# Patient Record
Sex: Female | Born: 1940
Health system: Southern US, Community
[De-identification: ages and names within clinical notes are randomized; demographics above are authoritative.]

## PROBLEM LIST (undated history)

## (undated) DIAGNOSIS — K635 Polyp of colon: Secondary | ICD-10-CM

## (undated) DIAGNOSIS — E785 Hyperlipidemia, unspecified: Secondary | ICD-10-CM

## (undated) DIAGNOSIS — I8393 Asymptomatic varicose veins of bilateral lower extremities: Secondary | ICD-10-CM

## (undated) DIAGNOSIS — M810 Age-related osteoporosis without current pathological fracture: Secondary | ICD-10-CM

## (undated) DIAGNOSIS — M1990 Unspecified osteoarthritis, unspecified site: Secondary | ICD-10-CM

## (undated) DIAGNOSIS — Z9989 Dependence on other enabling machines and devices: Secondary | ICD-10-CM

## (undated) DIAGNOSIS — K222 Esophageal obstruction: Secondary | ICD-10-CM

## (undated) DIAGNOSIS — K219 Gastro-esophageal reflux disease without esophagitis: Secondary | ICD-10-CM

## (undated) DIAGNOSIS — Z8619 Personal history of other infectious and parasitic diseases: Secondary | ICD-10-CM

## (undated) DIAGNOSIS — G4733 Obstructive sleep apnea (adult) (pediatric): Secondary | ICD-10-CM

## (undated) DIAGNOSIS — L920 Granuloma annulare: Secondary | ICD-10-CM

## (undated) DIAGNOSIS — E039 Hypothyroidism, unspecified: Secondary | ICD-10-CM

## (undated) HISTORY — PX: CHOLECYSTECTOMY: SHX55

## (undated) HISTORY — PX: ESOPHAGOGASTRODUODENOSCOPY: SHX1529

## (undated) HISTORY — PX: BREAST BIOPSY: SHX20

## (undated) HISTORY — PX: COLONOSCOPY: SHX174

## (undated) HISTORY — PX: OTHER SURGICAL HISTORY: SHX169

## (undated) HISTORY — PX: CARPAL TUNNEL RELEASE: SHX101

## (undated) HISTORY — PX: DILATION AND CURETTAGE OF UTERUS: SHX78

## (undated) HISTORY — PX: JOINT REPLACEMENT: SHX530

## (undated) HISTORY — PX: BACK SURGERY: SHX140

---

## 1971-09-19 HISTORY — PX: TUBAL LIGATION: SHX77

## 2006-02-13 ENCOUNTER — Ambulatory Visit: Payer: Self-pay | Admitting: Internal Medicine

## 2006-04-06 ENCOUNTER — Ambulatory Visit: Payer: Self-pay | Admitting: Rheumatology

## 2006-05-23 ENCOUNTER — Ambulatory Visit: Payer: Self-pay | Admitting: Unknown Physician Specialty

## 2006-10-05 ENCOUNTER — Ambulatory Visit: Payer: Self-pay | Admitting: Unknown Physician Specialty

## 2007-03-19 ENCOUNTER — Ambulatory Visit: Payer: Self-pay | Admitting: Internal Medicine

## 2007-11-05 ENCOUNTER — Ambulatory Visit: Payer: Self-pay | Admitting: Unknown Physician Specialty

## 2008-04-21 ENCOUNTER — Ambulatory Visit: Payer: Self-pay | Admitting: Internal Medicine

## 2009-04-27 ENCOUNTER — Ambulatory Visit: Payer: Self-pay | Admitting: Internal Medicine

## 2010-03-08 ENCOUNTER — Ambulatory Visit: Payer: Self-pay | Admitting: Internal Medicine

## 2010-05-10 ENCOUNTER — Ambulatory Visit: Payer: Self-pay | Admitting: Internal Medicine

## 2011-06-06 ENCOUNTER — Encounter: Payer: Self-pay | Admitting: Internal Medicine

## 2011-06-15 ENCOUNTER — Ambulatory Visit: Payer: Self-pay | Admitting: Internal Medicine

## 2011-06-19 ENCOUNTER — Encounter: Payer: Self-pay | Admitting: Internal Medicine

## 2011-07-20 ENCOUNTER — Encounter: Payer: Self-pay | Admitting: Internal Medicine

## 2011-08-19 ENCOUNTER — Encounter: Payer: Self-pay | Admitting: Internal Medicine

## 2011-09-19 ENCOUNTER — Encounter: Payer: Self-pay | Admitting: Internal Medicine

## 2011-09-19 HISTORY — PX: KNEE ARTHROPLASTY: SHX992

## 2012-01-17 ENCOUNTER — Ambulatory Visit: Payer: Self-pay

## 2012-02-17 HISTORY — PX: TOTAL KNEE ARTHROPLASTY: SHX125

## 2012-02-26 ENCOUNTER — Ambulatory Visit: Payer: Self-pay | Admitting: General Practice

## 2012-02-26 LAB — URINALYSIS, COMPLETE
Bacteria: NONE SEEN
Glucose,UR: NEGATIVE mg/dL (ref 0–75)
Ph: 6 (ref 4.5–8.0)
Specific Gravity: 1.016 (ref 1.003–1.030)
Squamous Epithelial: 4

## 2012-02-26 LAB — CBC
HGB: 13.9 g/dL (ref 12.0–16.0)
MCH: 32.2 pg (ref 26.0–34.0)
MCHC: 33.4 g/dL (ref 32.0–36.0)
MCV: 96 fL (ref 80–100)
RBC: 4.31 10*6/uL (ref 3.80–5.20)
RDW: 12.5 % (ref 11.5–14.5)

## 2012-02-26 LAB — MRSA PCR SCREENING

## 2012-02-26 LAB — APTT: Activated PTT: 27.5 secs (ref 23.6–35.9)

## 2012-02-26 LAB — PROTIME-INR: INR: 0.9

## 2012-02-26 LAB — BASIC METABOLIC PANEL
Anion Gap: 7 (ref 7–16)
BUN: 12 mg/dL (ref 7–18)
EGFR (Non-African Amer.): >60

## 2012-02-26 LAB — SEDIMENTATION RATE: Erythrocyte Sed Rate: 7 mm/hr (ref 0–30)

## 2012-03-11 ENCOUNTER — Inpatient Hospital Stay: Payer: Self-pay

## 2012-03-12 LAB — BASIC METABOLIC PANEL
Anion Gap: 5 — ABNORMAL LOW (ref 7–16)
Calcium, Total: 8.4 mg/dL — ABNORMAL LOW (ref 8.5–10.1)
Co2: 31 mmol/L (ref 21–32)
Creatinine: 0.8 mg/dL (ref 0.60–1.30)
EGFR (Non-African Amer.): >60
Glucose: 112 mg/dL — ABNORMAL HIGH (ref 65–99)
Potassium: 4.4 mmol/L (ref 3.5–5.1)

## 2012-03-12 LAB — PLATELET COUNT: Platelet: 119 10*3/uL — ABNORMAL LOW (ref 150–440)

## 2012-03-13 LAB — BASIC METABOLIC PANEL
BUN: 8 mg/dL (ref 7–18)
Calcium, Total: 8 mg/dL — ABNORMAL LOW (ref 8.5–10.1)
Chloride: 106 mmol/L (ref 98–107)
Co2: 30 mmol/L (ref 21–32)
EGFR (Non-African Amer.): >60
Potassium: 3.7 mmol/L (ref 3.5–5.1)
Sodium: 143 mmol/L (ref 136–145)

## 2012-04-29 ENCOUNTER — Ambulatory Visit: Payer: Self-pay | Admitting: General Practice

## 2012-04-29 DIAGNOSIS — I499 Cardiac arrhythmia, unspecified: Secondary | ICD-10-CM

## 2012-04-29 LAB — HEMOGLOBIN: HGB: 13.3 g/dL (ref 12.0–16.0)

## 2012-05-10 ENCOUNTER — Ambulatory Visit: Payer: Self-pay | Admitting: General Practice

## 2012-05-10 LAB — BASIC METABOLIC PANEL
Anion Gap: 4 — ABNORMAL LOW (ref 7–16)
BUN: 14 mg/dL (ref 7–18)
Calcium, Total: 9.1 mg/dL (ref 8.5–10.1)
EGFR (Non-African Amer.): >60
Glucose: 121 mg/dL — ABNORMAL HIGH (ref 65–99)
Osmolality: 281 (ref 275–301)
Potassium: 4.1 mmol/L (ref 3.5–5.1)

## 2012-05-10 LAB — URINALYSIS, COMPLETE
Bilirubin,UR: NEGATIVE
Ketone: NEGATIVE
Ph: 6 (ref 4.5–8.0)
RBC,UR: <1 /HPF (ref 0–5)
Specific Gravity: 1.004 (ref 1.003–1.030)
Squamous Epithelial: 1
WBC UR: 2 /HPF (ref 0–5)

## 2012-05-10 LAB — CBC
HGB: 12.4 g/dL (ref 12.0–16.0)
MCH: 30.8 pg (ref 26.0–34.0)
RBC: 4.02 10*6/uL (ref 3.80–5.20)
WBC: 6.8 10*3/uL (ref 3.6–11.0)

## 2012-05-10 LAB — PROTIME-INR: INR: 1

## 2012-05-27 ENCOUNTER — Other Ambulatory Visit: Payer: Self-pay | Admitting: Orthopedic Surgery

## 2012-05-29 ENCOUNTER — Other Ambulatory Visit (HOSPITAL_COMMUNITY): Payer: Medicare Other

## 2012-05-31 ENCOUNTER — Encounter (HOSPITAL_COMMUNITY): Payer: Self-pay | Admitting: Pharmacy Technician

## 2012-06-12 ENCOUNTER — Other Ambulatory Visit (HOSPITAL_COMMUNITY): Payer: Medicare Other

## 2012-06-13 NOTE — Pre-Procedure Instructions (Signed)
19 Joanne Nash  06/13/2012   Your procedure is scheduled on: Monday, September 30th.  Report to [facility] at 10:30 AM.  Call this number if you have problems the morning of surgery: [phone]   Remember:   Do not eat food or anything to drink:After Midnight.     Take these medicines the morning of surgery with A SIP OF WATER: Citalopram (Celexa), Levothyroxine(synthyroid), Omeprazole (Prilosec).   Do not wear jewelry, make-up or nail polish.  Do not wear lotions, powders, or perfumes. You may wear deodorant.  Do not shave 48 hours prior to surgery. Men may shave face and neck.  Do not bring valuables to the hospital.  Contacts, dentures or bridgework may not be worn into surgery.  Leave suitcase in the car. After surgery it may be brought to your room.  For patients admitted to the hospital, checkout time is 11:00 AM the day of discharge.   Patients discharged the day of surgery will not be allowed to drive home.  Name and phone number of your driver: NA  Special Instructions: Incentive Spirometry - Practice and bring it with you on the day of surgery. Shower using CHG 2 nights before surgery and the night before surgery.  If you shower the day of surgery use CHG.  Use special wash - you have one bottle of CHG for all showers.  You should use approximately 1/3 of the bottle for each shower.   Please read over the following fact sheets that you were given: Pain Booklet, Coughing and Deep Breathing, Blood Transfusion Information and Surgical Site Infection Prevention

## 2012-06-14 ENCOUNTER — Encounter (HOSPITAL_COMMUNITY)
Admission: RE | Admit: 2012-06-14 | Discharge: 2012-06-14 | Disposition: A | Discharge status: Home / Self Care | Payer: Medicare Other | Source: Ambulatory Visit | Attending: Orthopedic Surgery | Admitting: Orthopedic Surgery

## 2012-06-14 ENCOUNTER — Encounter (HOSPITAL_COMMUNITY): Payer: Self-pay

## 2012-06-14 HISTORY — DX: Hyperlipidemia, unspecified: E78.5

## 2012-06-14 HISTORY — DX: Hypothyroidism, unspecified: E03.9

## 2012-06-14 HISTORY — DX: Gastro-esophageal reflux disease without esophagitis: K21.9

## 2012-06-14 HISTORY — DX: Unspecified osteoarthritis, unspecified site: M19.90

## 2012-06-14 LAB — URINALYSIS, ROUTINE W REFLEX MICROSCOPIC
Bilirubin Urine: NEGATIVE
Glucose, UA: NEGATIVE mg/dL
Ketones, ur: NEGATIVE mg/dL
Specific Gravity, Urine: 1.02 (ref 1.005–1.030)
pH: 6.5 (ref 5.0–8.0)

## 2012-06-14 LAB — CBC WITH DIFFERENTIAL/PLATELET
HCT: 42.3 % (ref 36.0–46.0)
Hemoglobin: 13.9 g/dL (ref 12.0–15.0)
Lymphocytes Relative: 34 % (ref 12–46)
Lymphs Abs: 2.2 10*3/uL (ref 0.7–4.0)
Monocytes Absolute: 0.5 10*3/uL (ref 0.1–1.0)
Monocytes Relative: 7 % (ref 3–12)
Neutro Abs: 3.6 10*3/uL (ref 1.7–7.7)
WBC: 6.4 10*3/uL (ref 4.0–10.5)

## 2012-06-14 LAB — COMPREHENSIVE METABOLIC PANEL
BUN: 14 mg/dL (ref 6–23)
CO2: 30 mEq/L (ref 19–32)
Chloride: 102 mEq/L (ref 96–112)
Creatinine, Ser: 0.6 mg/dL (ref 0.50–1.10)
GFR calc non Af Amer: 89 mL/min — ABNORMAL LOW (ref >90)
Total Bilirubin: 0.7 mg/dL (ref 0.3–1.2)

## 2012-06-14 LAB — URINE MICROSCOPIC-ADD ON

## 2012-06-14 LAB — APTT: aPTT: 29 seconds (ref 24–37)

## 2012-06-14 LAB — PROTIME-INR
INR: 1 (ref 0.00–1.49)
Prothrombin Time: 13.1 seconds (ref 11.6–15.2)

## 2012-06-14 NOTE — Progress Notes (Signed)
I faxed a request to [facility] for EKG, D?c Summary and Anesthesia  Record for surgery done earlier this year.  I faxed a request to sleep med in [place] requesting sleep study results.

## 2012-06-16 MED ORDER — POVIDONE-IODINE 7.5 % EX SOLN
Freq: Once | CUTANEOUS | Status: DC
  Filled 2012-06-16: qty 118

## 2012-06-16 MED ORDER — CEFAZOLIN SODIUM-DEXTROSE 2-3 GM-% IV SOLR
2.0000 g | INTRAVENOUS | Status: AC
  Administered 2012-06-17: 2 g via INTRAVENOUS

## 2012-06-17 ENCOUNTER — Encounter (HOSPITAL_COMMUNITY): Payer: Self-pay | Admitting: *Deleted

## 2012-06-17 ENCOUNTER — Inpatient Hospital Stay (HOSPITAL_COMMUNITY): Payer: Medicare Other

## 2012-06-17 ENCOUNTER — Inpatient Hospital Stay (HOSPITAL_COMMUNITY): Payer: Medicare Other | Admitting: Certified Registered"

## 2012-06-17 ENCOUNTER — Inpatient Hospital Stay (HOSPITAL_COMMUNITY)
Admission: RE | Admit: 2012-06-17 | Discharge: 2012-06-18 | DRG: 484 | Disposition: A | Discharge status: Home / Self Care | Payer: Medicare Other | Source: Ambulatory Visit | Attending: Orthopedic Surgery | Admitting: Orthopedic Surgery

## 2012-06-17 ENCOUNTER — Encounter (HOSPITAL_COMMUNITY): Payer: Self-pay | Admitting: Certified Registered"

## 2012-06-17 ENCOUNTER — Encounter (HOSPITAL_COMMUNITY)
Admission: RE | Disposition: A | Discharge status: Home / Self Care | Payer: Self-pay | Source: Ambulatory Visit | Attending: Orthopedic Surgery

## 2012-06-17 ENCOUNTER — Encounter (HOSPITAL_COMMUNITY): Payer: Self-pay | Admitting: General Practice

## 2012-06-17 DIAGNOSIS — K219 Gastro-esophageal reflux disease without esophagitis: Secondary | ICD-10-CM | POA: Diagnosis present

## 2012-06-17 DIAGNOSIS — Z96619 Presence of unspecified artificial shoulder joint: Secondary | ICD-10-CM

## 2012-06-17 DIAGNOSIS — E039 Hypothyroidism, unspecified: Secondary | ICD-10-CM | POA: Diagnosis present

## 2012-06-17 DIAGNOSIS — E785 Hyperlipidemia, unspecified: Secondary | ICD-10-CM | POA: Diagnosis present

## 2012-06-17 DIAGNOSIS — Z96659 Presence of unspecified artificial knee joint: Secondary | ICD-10-CM

## 2012-06-17 DIAGNOSIS — Z01812 Encounter for preprocedural laboratory examination: Secondary | ICD-10-CM

## 2012-06-17 DIAGNOSIS — M19019 Primary osteoarthritis, unspecified shoulder: Secondary | ICD-10-CM

## 2012-06-17 DIAGNOSIS — Z87891 Personal history of nicotine dependence: Secondary | ICD-10-CM

## 2012-06-17 DIAGNOSIS — G4733 Obstructive sleep apnea (adult) (pediatric): Secondary | ICD-10-CM | POA: Diagnosis present

## 2012-06-17 HISTORY — PX: REVERSE SHOULDER ARTHROPLASTY: SHX5054

## 2012-06-17 HISTORY — DX: Dependence on other enabling machines and devices: Z99.89

## 2012-06-17 HISTORY — PX: REVERSE TOTAL SHOULDER ARTHROPLASTY: SHX2344

## 2012-06-17 HISTORY — DX: Obstructive sleep apnea (adult) (pediatric): G47.33

## 2012-06-17 SURGERY — ARTHROPLASTY, SHOULDER, TOTAL, REVERSE
Anesthesia: General | Site: Shoulder | Laterality: Right | Wound class: Clean

## 2012-06-17 MED ORDER — SIMVASTATIN 20 MG PO TABS
20.0000 mg | ORAL_TABLET | Freq: Every evening | ORAL | Status: DC
  Administered 2012-06-17: 20 mg via ORAL
  Filled 2012-06-17 (×2): qty 1

## 2012-06-17 MED ORDER — ONDANSETRON HCL 4 MG/2ML IJ SOLN: 4.0000 mg | Freq: Four times a day (QID) | INTRAMUSCULAR | Status: DC | PRN

## 2012-06-17 MED ORDER — MORPHINE SULFATE 2 MG/ML IJ SOLN
2.0000 mg | INTRAMUSCULAR | Status: DC | PRN
  Administered 2012-06-18 (×2): 2 mg via INTRAVENOUS
  Filled 2012-06-17 (×2): qty 1

## 2012-06-17 MED ORDER — PANTOPRAZOLE SODIUM 40 MG PO TBEC
40.0000 mg | DELAYED_RELEASE_TABLET | Freq: Every day | ORAL | Status: DC
  Administered 2012-06-18: 40 mg via ORAL
  Filled 2012-06-17: qty 1

## 2012-06-17 MED ORDER — HYDROCODONE-ACETAMINOPHEN 5-325 MG PO TABS
1.0000 | ORAL_TABLET | ORAL | Status: DC | PRN
  Administered 2012-06-17 – 2012-06-18 (×2): 2 via ORAL
  Filled 2012-06-17 (×2): qty 2

## 2012-06-17 MED ORDER — GLYCOPYRROLATE 0.2 MG/ML IJ SOLN
INTRAMUSCULAR | Status: DC | PRN
  Administered 2012-06-17: 0.4 mg via INTRAVENOUS

## 2012-06-17 MED ORDER — CEFAZOLIN SODIUM-DEXTROSE 2-3 GM-% IV SOLR
INTRAVENOUS | Status: AC
  Filled 2012-06-17: qty 50

## 2012-06-17 MED ORDER — ACETAMINOPHEN 325 MG PO TABS: 650.0000 mg | ORAL_TABLET | Freq: Four times a day (QID) | ORAL | Status: DC | PRN

## 2012-06-17 MED ORDER — ONDANSETRON HCL 4 MG PO TABS: 4.0000 mg | ORAL_TABLET | Freq: Four times a day (QID) | ORAL | Status: DC | PRN

## 2012-06-17 MED ORDER — PHENYLEPHRINE HCL 10 MG/ML IJ SOLN
INTRAMUSCULAR | Status: DC | PRN
  Administered 2012-06-17 (×3): 40 ug via INTRAVENOUS
  Administered 2012-06-17: 80 ug via INTRAVENOUS

## 2012-06-17 MED ORDER — MICROFIBRILLAR COLL HEMOSTAT EX PADS
MEDICATED_PAD | CUTANEOUS | Status: DC | PRN
  Administered 2012-06-17: 1 via TOPICAL

## 2012-06-17 MED ORDER — CITALOPRAM HYDROBROMIDE 20 MG PO TABS
20.0000 mg | ORAL_TABLET | Freq: Every day | ORAL | Status: DC
  Administered 2012-06-18: 20 mg via ORAL
  Filled 2012-06-17 (×2): qty 1

## 2012-06-17 MED ORDER — BUPIVACAINE-EPINEPHRINE PF 0.5-1:200000 % IJ SOLN
INTRAMUSCULAR | Status: DC | PRN
  Administered 2012-06-17: 20 mL

## 2012-06-17 MED ORDER — HYDROMORPHONE HCL PF 1 MG/ML IJ SOLN: 0.2500 mg | INTRAMUSCULAR | Status: DC | PRN

## 2012-06-17 MED ORDER — CEFAZOLIN SODIUM 1-5 GM-% IV SOLN
1.0000 g | Freq: Four times a day (QID) | INTRAVENOUS | Status: AC
  Administered 2012-06-17 – 2012-06-18 (×3): 1 g via INTRAVENOUS
  Filled 2012-06-17 (×3): qty 50

## 2012-06-17 MED ORDER — OXYCODONE HCL 5 MG/5ML PO SOLN: 5.0000 mg | Freq: Once | ORAL | Status: DC | PRN

## 2012-06-17 MED ORDER — OXYCODONE HCL 5 MG PO TABS: 5.0000 mg | ORAL_TABLET | Freq: Once | ORAL | Status: DC | PRN

## 2012-06-17 MED ORDER — ROCURONIUM BROMIDE 100 MG/10ML IV SOLN
INTRAVENOUS | Status: DC | PRN
  Administered 2012-06-17: 50 mg via INTRAVENOUS

## 2012-06-17 MED ORDER — LIDOCAINE HCL (CARDIAC) 20 MG/ML IV SOLN
INTRAVENOUS | Status: DC | PRN
  Administered 2012-06-17: 100 mg via INTRAVENOUS

## 2012-06-17 MED ORDER — LEVOTHYROXINE SODIUM 88 MCG PO TABS
88.0000 ug | ORAL_TABLET | Freq: Every day | ORAL | Status: DC
  Administered 2012-06-18: 88 ug via ORAL
  Filled 2012-06-17 (×2): qty 1

## 2012-06-17 MED ORDER — LACTATED RINGERS IV SOLN
INTRAVENOUS | Status: DC
  Administered 2012-06-17 (×2): via INTRAVENOUS

## 2012-06-17 MED ORDER — SENNA 8.6 MG PO TABS
1.0000 | ORAL_TABLET | Freq: Two times a day (BID) | ORAL | Status: DC
  Administered 2012-06-18: 8.6 mg via ORAL
  Filled 2012-06-17 (×3): qty 1

## 2012-06-17 MED ORDER — SODIUM CHLORIDE 0.9 % IR SOLN
Status: DC | PRN
  Administered 2012-06-17: 1000 mL

## 2012-06-17 MED ORDER — EPHEDRINE SULFATE 50 MG/ML IJ SOLN
INTRAMUSCULAR | Status: DC | PRN
  Administered 2012-06-17 (×7): 5 mg via INTRAVENOUS
  Administered 2012-06-17: 10 mg via INTRAVENOUS
  Administered 2012-06-17: 5 mg via INTRAVENOUS

## 2012-06-17 MED ORDER — PROPOFOL 10 MG/ML IV BOLUS
INTRAVENOUS | Status: DC | PRN
  Administered 2012-06-17: 120 mg via INTRAVENOUS

## 2012-06-17 MED ORDER — NEOSTIGMINE METHYLSULFATE 1 MG/ML IJ SOLN
INTRAMUSCULAR | Status: DC | PRN
  Administered 2012-06-17: 3 mg via INTRAVENOUS

## 2012-06-17 MED ORDER — MEPERIDINE HCL 25 MG/ML IJ SOLN: 6.2500 mg | INTRAMUSCULAR | Status: DC | PRN

## 2012-06-17 MED ORDER — METOCLOPRAMIDE HCL 10 MG PO TABS: 5.0000 mg | ORAL_TABLET | Freq: Three times a day (TID) | ORAL | Status: DC | PRN

## 2012-06-17 MED ORDER — ZOLPIDEM TARTRATE 5 MG PO TABS: 5.0000 mg | ORAL_TABLET | Freq: Every evening | ORAL | Status: DC | PRN

## 2012-06-17 MED ORDER — POLYVINYL ALCOHOL 1.4 % OP SOLN
1.0000 [drp] | Freq: Three times a day (TID) | OPHTHALMIC | Status: DC | PRN
  Filled 2012-06-17: qty 15

## 2012-06-17 MED ORDER — BISACODYL 5 MG PO TBEC: 5.0000 mg | DELAYED_RELEASE_TABLET | Freq: Every day | ORAL | Status: DC | PRN

## 2012-06-17 MED ORDER — SODIUM CHLORIDE 0.9 % IV SOLN: INTRAVENOUS | Status: DC

## 2012-06-17 MED ORDER — ASPIRIN EC 325 MG PO TBEC
325.0000 mg | DELAYED_RELEASE_TABLET | Freq: Two times a day (BID) | ORAL | Status: DC
  Administered 2012-06-17 – 2012-06-18 (×2): 325 mg via ORAL
  Filled 2012-06-17 (×4): qty 1

## 2012-06-17 MED ORDER — DIPHENHYDRAMINE HCL 12.5 MG/5ML PO ELIX
12.5000 mg | ORAL_SOLUTION | ORAL | Status: DC | PRN
  Administered 2012-06-18: 25 mg via ORAL
  Filled 2012-06-17: qty 5

## 2012-06-17 MED ORDER — FLEET ENEMA 7-19 GM/118ML RE ENEM: 1.0000 | ENEMA | Freq: Once | RECTAL | Status: AC | PRN

## 2012-06-17 MED ORDER — METOCLOPRAMIDE HCL 5 MG/ML IJ SOLN: 5.0000 mg | Freq: Three times a day (TID) | INTRAMUSCULAR | Status: DC | PRN

## 2012-06-17 MED ORDER — FENTANYL CITRATE 0.05 MG/ML IJ SOLN
INTRAMUSCULAR | Status: DC | PRN
  Administered 2012-06-17: 50 ug via INTRAVENOUS
  Administered 2012-06-17: 75 ug via INTRAVENOUS
  Administered 2012-06-17: 25 ug via INTRAVENOUS

## 2012-06-17 MED ORDER — ONDANSETRON HCL 4 MG/2ML IJ SOLN: 4.0000 mg | Freq: Once | INTRAMUSCULAR | Status: DC | PRN

## 2012-06-17 MED ORDER — INFLUENZA VIRUS VACC SPLIT PF IM SUSP
0.5000 mL | INTRAMUSCULAR | Status: AC
  Administered 2012-06-18: 0.5 mL via INTRAMUSCULAR
  Filled 2012-06-17: qty 0.5

## 2012-06-17 MED ORDER — ALUM & MAG HYDROXIDE-SIMETH 200-200-20 MG/5ML PO SUSP: 30.0000 mL | ORAL | Status: DC | PRN

## 2012-06-17 MED ORDER — ADULT MULTIVITAMIN W/MINERALS CH
1.0000 | ORAL_TABLET | Freq: Every day | ORAL | Status: DC
  Administered 2012-06-18: 1 via ORAL
  Filled 2012-06-17: qty 1

## 2012-06-17 MED ORDER — MENTHOL 3 MG MT LOZG: 1.0000 | LOZENGE | OROMUCOSAL | Status: DC | PRN

## 2012-06-17 MED ORDER — ACETAMINOPHEN 10 MG/ML IV SOLN
INTRAVENOUS | Status: DC | PRN
  Administered 2012-06-17: 1000 mg via INTRAVENOUS

## 2012-06-17 MED ORDER — MUPIROCIN 2 % EX OINT
TOPICAL_OINTMENT | Freq: Two times a day (BID) | CUTANEOUS | Status: DC
  Administered 2012-06-18: 11:00:00 via NASAL
  Filled 2012-06-17: qty 22

## 2012-06-17 MED ORDER — PHENOL 1.4 % MT LIQD: 1.0000 | OROMUCOSAL | Status: DC | PRN

## 2012-06-17 MED ORDER — HYPROMELLOSE (GONIOSCOPIC) 2.5 % OP SOLN
1.0000 [drp] | Freq: Three times a day (TID) | OPHTHALMIC | Status: DC | PRN
  Filled 2012-06-17: qty 15

## 2012-06-17 MED ORDER — ONDANSETRON HCL 4 MG/2ML IJ SOLN
INTRAMUSCULAR | Status: DC | PRN
  Administered 2012-06-17: 4 mg via INTRAVENOUS

## 2012-06-17 MED ORDER — OXYCODONE-ACETAMINOPHEN 5-325 MG PO TABS
1.0000 | ORAL_TABLET | ORAL | Status: DC | PRN
  Administered 2012-06-18 (×2): 2 via ORAL
  Filled 2012-06-17 (×2): qty 2

## 2012-06-17 MED ORDER — ACETAMINOPHEN 10 MG/ML IV SOLN
INTRAVENOUS | Status: AC
  Filled 2012-06-17: qty 100

## 2012-06-17 MED ORDER — ACETAMINOPHEN 650 MG RE SUPP: 650.0000 mg | Freq: Four times a day (QID) | RECTAL | Status: DC | PRN

## 2012-06-17 MED ORDER — MIDAZOLAM HCL 5 MG/5ML IJ SOLN
INTRAMUSCULAR | Status: DC | PRN
  Administered 2012-06-17: 2 mg via INTRAVENOUS

## 2012-06-17 MED ORDER — SENNOSIDES-DOCUSATE SODIUM 8.6-50 MG PO TABS
1.0000 | ORAL_TABLET | Freq: Every evening | ORAL | Status: DC | PRN
  Administered 2012-06-17: 1 via ORAL
  Filled 2012-06-17: qty 1

## 2012-06-17 SURGICAL SUPPLY — 61 items
BLADE SAW SAG 73X25 THK (BLADE) ×1
BLADE SAW SGTL 73X25 THK (BLADE) ×1 IMPLANT
BOWL SMART MIX CTS (DISPOSABLE) IMPLANT
CHLORAPREP W/TINT 26ML (MISCELLANEOUS) ×2 IMPLANT
CLOTH BEACON ORANGE TIMEOUT ST (SAFETY) ×2 IMPLANT
CLSR STERI-STRIP ANTIMIC 1/2X4 (GAUZE/BANDAGES/DRESSINGS) ×2 IMPLANT
COVER SURGICAL LIGHT HANDLE (MISCELLANEOUS) ×2 IMPLANT
DRAPE INCISE IOBAN 66X45 STRL (DRAPES) ×2 IMPLANT
DRAPE SURG 17X23 STRL (DRAPES) ×2 IMPLANT
DRAPE U-SHAPE 47X51 STRL (DRAPES) ×2 IMPLANT
DRSG ADAPTIC 3X8 NADH LF (GAUZE/BANDAGES/DRESSINGS) ×2 IMPLANT
DRSG PAD ABDOMINAL 8X10 ST (GAUZE/BANDAGES/DRESSINGS) ×2 IMPLANT
ELECT REM PT RETURN 9FT ADLT (ELECTROSURGICAL) ×2
ELECTRODE REM PT RTRN 9FT ADLT (ELECTROSURGICAL) ×1 IMPLANT
EVACUATOR 1/8 PVC DRAIN (DRAIN) ×2 IMPLANT
GLOVE BIO SURGEON STRL SZ7 (GLOVE) ×2 IMPLANT
GLOVE BIO SURGEON STRL SZ7.5 (GLOVE) ×2 IMPLANT
GLOVE BIOGEL PI IND STRL 8 (GLOVE) ×1 IMPLANT
GLOVE BIOGEL PI INDICATOR 8 (GLOVE) ×1
GOWN PREVENTION PLUS LG XLONG (DISPOSABLE) ×2 IMPLANT
GOWN PREVENTION PLUS XLARGE (GOWN DISPOSABLE) ×2 IMPLANT
GOWN STRL NON-REIN LRG LVL3 (GOWN DISPOSABLE) ×4 IMPLANT
HANDPIECE INTERPULSE COAX TIP (DISPOSABLE)
HEMOSTAT SURGICEL 2X14 (HEMOSTASIS) IMPLANT
HOOD PEEL AWAY FACE SHEILD DIS (HOOD) ×4 IMPLANT
KIT BASIN OR (CUSTOM PROCEDURE TRAY) ×2 IMPLANT
KIT ROOM TURNOVER OR (KITS) ×2 IMPLANT
MANIFOLD NEPTUNE II (INSTRUMENTS) ×2 IMPLANT
NEEDLE HYPO 25GX1X1/2 BEV (NEEDLE) ×2 IMPLANT
NEEDLE MAYO TROCAR (NEEDLE) ×2 IMPLANT
NOZZLE PRISM 8.5MM (MISCELLANEOUS) IMPLANT
NS IRRIG 1000ML POUR BTL (IV SOLUTION) ×2 IMPLANT
PACK SHOULDER (CUSTOM PROCEDURE TRAY) ×2 IMPLANT
PAD ARMBOARD 7.5X6 YLW CONV (MISCELLANEOUS) ×4 IMPLANT
RETRIEVER SUT HEWSON (MISCELLANEOUS) IMPLANT
SET HNDPC FAN SPRY TIP SCT (DISPOSABLE) IMPLANT
SLING ARM IMMOBILIZER LRG (SOFTGOODS) ×2 IMPLANT
SLING ARM IMMOBILIZER MED (SOFTGOODS) IMPLANT
SPONGE GAUZE 4X4 12PLY (GAUZE/BANDAGES/DRESSINGS) ×2 IMPLANT
SPONGE LAP 18X18 X RAY DECT (DISPOSABLE) ×2 IMPLANT
SPONGE LAP 4X18 X RAY DECT (DISPOSABLE) ×4 IMPLANT
STRIP CLOSURE SKIN 1/2X4 (GAUZE/BANDAGES/DRESSINGS) ×2 IMPLANT
SUCTION FRAZIER TIP 10 FR DISP (SUCTIONS) ×2 IMPLANT
SUPPORT WRAP ARM LG (MISCELLANEOUS) ×2 IMPLANT
SUT ETHIBOND NAB CT1 #1 30IN (SUTURE) ×6 IMPLANT
SUT FIBERWIRE #2 38 T-5 BLUE (SUTURE)
SUT MNCRL AB 4-0 PS2 18 (SUTURE) ×2 IMPLANT
SUT SILK 2 0 TIES 17X18 (SUTURE) ×1
SUT SILK 2-0 18XBRD TIE BLK (SUTURE) ×1 IMPLANT
SUT VIC AB 0 CTB1 27 (SUTURE) ×2 IMPLANT
SUT VIC AB 2-0 CT1 27 (SUTURE) ×2
SUT VIC AB 2-0 CT1 TAPERPNT 27 (SUTURE) ×2 IMPLANT
SUTURE FIBERWR #2 38 T-5 BLUE (SUTURE) IMPLANT
SYR CONTROL 10ML LL (SYRINGE) ×2 IMPLANT
SYRINGE TOOMEY DISP (SYRINGE) IMPLANT
TAPE CLOTH SURG 6X10 WHT LF (GAUZE/BANDAGES/DRESSINGS) ×2 IMPLANT
TOWEL OR 17X24 6PK STRL BLUE (TOWEL DISPOSABLE) ×2 IMPLANT
TOWEL OR 17X26 10 PK STRL BLUE (TOWEL DISPOSABLE) ×2 IMPLANT
TRAY FOLEY CATH 14FR (SET/KITS/TRAYS/PACK) IMPLANT
WATER STERILE IRR 1000ML POUR (IV SOLUTION) ×2 IMPLANT
YANKAUER SUCT BULB TIP NO VENT (SUCTIONS) ×2 IMPLANT

## 2012-06-17 NOTE — Anesthesia Postprocedure Evaluation (Signed)
Anesthesia Post Note  Patient: Joanne Nash  Procedure(s) Performed: Procedure(s) (LRB): REVERSE SHOULDER ARTHROPLASTY (Right)  Anesthesia type: general  Patient location: PACU  Post pain: Pain level controlled  Post assessment: Patient's Cardiovascular Status Stable  Last Vitals:  Filed Vitals:   06/17/12 1548  BP:   Pulse:   Temp: 36.4 C  Resp:     Post vital signs: Reviewed and stable  Level of consciousness: sedated  Complications: No apparent anesthesia complications

## 2012-06-17 NOTE — Plan of Care (Signed)
Problem: Consults Goal: Diagnosis- Total Joint Replacement Outcome: Completed/Met Date Met:  06/17/12 R Reverse Total shoulder.

## 2012-06-17 NOTE — Transfer of Care (Signed)
Immediate Anesthesia Transfer of Care Note  Patient: Joanne Nash  Procedure(s) Performed: Procedure(s) (LRB) with comments: REVERSE SHOULDER ARTHROPLASTY (Right)  Patient Location: PACU  Anesthesia Type: General  Level of Consciousness: awake, alert  and oriented  Airway & Oxygen Therapy: Patient Spontanous Breathing and Patient connected to nasal cannula oxygen  Post-op Assessment: Report given to PACU RN  Post vital signs: Reviewed and stable  Complications: No apparent anesthesia complications

## 2012-06-17 NOTE — Anesthesia Procedure Notes (Addendum)
Anesthesia Regional Block:  Interscalene brachial plexus block  Pre-Anesthetic Checklist: ,, timeout performed, Correct Patient, Correct Site, Correct Laterality, Correct Procedure, Correct Position, site marked, Risks and benefits discussed, at surgeon's request and post-op pain management  Laterality: Upper and Right  Prep: chloraprep       Needles:  Injection technique: Single-shot  Needle Type: Echogenic Needle      Needle Gauge: 22 and 22 G  Needle insertion depth: 2 cm   Additional Needles:  Procedures: ultrasound guided Interscalene brachial plexus block  Nerve Stimulator or Paresthesia:  Response: Twitch elicited, 0.5 mA, 0.3 ms,   Additional Responses:   Narrative:  Start time: 06/17/2012 12:10 PM End time: 06/17/2012 12:38 PM Injection made incrementally with aspirations every 5 mL.  Performed by: Personally  Anesthesiologist: Alma Friendly, MD  Additional Notes: Block assessed prior to start of surgery  Interscalene brachial plexus block Procedure Name: Intubation Date/Time: 06/17/2012 12:55 PM Performed by: Jefm Miles E Pre-anesthesia Checklist: Patient identified, Timeout performed, Emergency Drugs available, Suction available and Patient being monitored Patient Re-evaluated:Patient Re-evaluated prior to inductionOxygen Delivery Method: Circle system utilized Preoxygenation: Pre-oxygenation with 100% oxygen Intubation Type: IV induction Ventilation: Mask ventilation without difficulty Laryngoscope Size: Mac and 3 Grade View: Grade I Tube type: Oral Tube size: 7.0 mm Number of attempts: 1 Airway Equipment and Method: Stylet Placement Confirmation: ETT inserted through vocal cords under direct vision,  breath sounds checked- equal and bilateral and positive ETCO2 Secured at: 20 cm Tube secured with: Tape Dental Injury: Teeth and Oropharynx as per pre-operative assessment

## 2012-06-17 NOTE — Op Note (Signed)
Procedure(s): REVERSE SHOULDER ARTHROPLASTY Procedure Note  Joanne Nash female 71 y.o. 06/17/2012  Procedure(s) and Anesthesia Type:    * REVERSE SHOULDER ARTHROPLASTY - Regional   Indications:  71 y.o. female  With endstage right shoulder arthritis with irrepairable rotator cuff tear. Pain and dysfunction interfered with quality of life and nonoperative treatment with activity modification, NSAIDS and injections failed.     Surgeon: Mable Paris   Assistants: Jiles Harold PA-C (Danielle was scrubbed throughout the procedure and was essential for exposure, retraction, and closure.)  Anesthesia: General endotracheal anesthesia with preoperative interscalene block    Procedure Detail  REVERSE SHOULDER ARTHROPLASTY   Estimated Blood Loss:  200 mL         Drains: 1 medium hemovac  Blood Given: none          Specimens: none        Complications:  * No complications entered in OR log *         Disposition: PACU - hemodynamically stable.         Condition: stable      OPERATIVE FINDINGS:  A DePuy press-fit reverse total shoulder arthroplasty was placed with a  size 10 stem, a 38 offset glenosphere, and a 6-mm poly insert. The base plate  fixation was excellent.  PROCEDURE: The patient was identified in the preoperative holding area  where I personally marked the operative site after verifying site, side,  and procedure with the patient. An interscalene block given by  the attending anesthesiologist in the holding area and the patient was taken back to the operating room where all extremities were  carefully padded in position after general anesthesia was induced. She  was placed in a beach-chair position and the operative upper extremity was  prepped and draped in a standard sterile fashion. An approximately 10-  cm incision was made from the tip of the coracoid process to the center  point of the humerus at the level of the axilla. Dissection was  carried  down through subcutaneous tissues to the level of the cephalic vein  which was taken laterally with the deltoid. The pectoralis major was  retracted medially. The subdeltoid space was developed and the lateral  edge of the conjoined tendon was identified. The undersurface of  conjoined tendon was palpated and the musculocutaneous nerve was not in  the field. Retractor was placed underneath the conjoined and second  retractor was placed lateral into the deltoid. The circumflex humeral  artery and vessels were identified and clamped and coagulated. The  biceps tendon was scarred in and already torn proximally.  The subscapularis was torn and retracted.  The  joint was then gently externally rotated while the capsule was released  from the humeral neck around to just beyond the 6 o'clock position. At  this point, the joint was dislocated and the humeral head was presented  into the wound. The excessive osteophyte formation was removed with a  large rongeur and the intramedullary canal was then entered with  sequential reamers up to a 10-mm reamer which was felt to be the  appropriate size. The cutting guide was used to make the appropriate  head cut and the head was saved potentially bone grafting.  The proximal metaphyseal reaming guide was then placed and the appropriate size reamer was selected. The metaphyseal bone was then reamed. The trial implant was then placed. The trial was then left in place while the glenoid was exposed with the arm in an  abducted extended position. The anterior and posterior labrum were  completely excised and the capsule was released circumferentially to  allow for exposure of the glenoid for preparation. The guidepin was  placed using the guide in neutral angulation and the reamer was  placed over the guidepin to ream down to concentric surface. Superior  hand reamer was used as well. The central drill hole was then made and  stayed within the glenoid  vault. The Metaglene was then impacted with  Excellent press fit. The superior and inferior screws were then  drilled, measured, and filled with appropriate-sized locking screw  alternatively tightening top and bottom to bring the Metaglene down  tightly against the bone. Posterior and anterior screws were then placed. Fixation was excellent.  The humerus was then again exposed and the trial implant was removed. The gleno sphere was placed in the metaphysis of the proximal humerus and then reduced to the base plate.  The glenosphere was then impacted over the [facility] taper and tightened down  with the screw. The eccentric offset was placed inferiorly. The trial implant was then again placed in the proximal humerus and the poly trials were tested and the above implant was felt to be the most appropriate soft tissue tensioning with excellent stability and  excellent range of motion. Therefore, final humeral stem was placed press fit with some impaction grafting.  And then the trial polyethylene inserts were tested again and the above implant was felt to be the most appropriate for final insertion. The joint was reduced taken through full range of motion and felt to be stable. Soft tissue tension was appropriate. A medium Hemovac drain was placed out underneath the deltoid prior to closure. The joint was then copiously irrigated with pulse  lavage and the wound was then closed. The subscapularis was not repaired.  Skin was closed with 2-0 Vicryl in a deep dermal layer and 4-0  Monocryl for skin closure. Steri-Strips were applied.terile  dressings were then applied as well as a sling. The patient was allowed  to awaken from general anesthesia, transferred to stretcher, and taken  to recovery room in stable condition.   POSTOPERATIVE PLAN: The patient will be kept in the hospital postoperatively  for pain control and therapy.

## 2012-06-17 NOTE — Anesthesia Preprocedure Evaluation (Addendum)
Anesthesia Evaluation  Patient identified by MRN, date of birth, ID band Patient awake    Reviewed: Allergy & Precautions, H&P , NPO status , Patient's Chart, lab work & pertinent test results  Airway Mallampati: I TM Distance: >3 FB Neck ROM: Full    Dental  (+) Partial Upper, Partial Lower and Dental Advisory Given   Pulmonary sleep apnea and Continuous Positive Airway Pressure Ventilation ,  breath sounds clear to auscultation        Cardiovascular Rhythm:Regular Rate:Normal     Neuro/Psych    GI/Hepatic GERD-  Medicated and Controlled,  Endo/Other  Hypothyroidism   Renal/GU      Musculoskeletal   Abdominal   Peds  Hematology   Anesthesia Other Findings   Reproductive/Obstetrics                           Anesthesia Physical Anesthesia Plan  ASA: II  Anesthesia Plan: General   Post-op Pain Management:    Induction: Intravenous  Airway Management Planned: Oral ETT  Additional Equipment:   Intra-op Plan:   Post-operative Plan: Extubation in OR  Informed Consent: I have reviewed the patients History and Physical, chart, labs and discussed the procedure including the risks, benefits and alternatives for the proposed anesthesia with the patient or authorized representative who has indicated his/her understanding and acceptance.   Dental advisory given  Plan Discussed with: CRNA and Surgeon  Anesthesia Plan Comments:        Anesthesia Quick Evaluation

## 2012-06-17 NOTE — H&P (Signed)
Joanne Nash [address] Joanne Nash is an 71 y.o. female.   Chief Complaint: R shoulder pain/dysfunction  HPI: Severe R shoulder pain/dysfunction with rotator cuff tear arthropathy, failed nonop tx.  Past Medical History  Diagnosis Date  . Hypothyroidism   . Arthritis   . GERD (gastroesophageal reflux disease)   . Hyperlipemia   . Sleep apnea     CPAP    Past Surgical History  Procedure Date  . Joint replacement 04/2012    Right Knee  . Carpal tunnel release     Left  . Cholecystectomy     Lap    History reviewed. No pertinent family history. Social History:  reports that she quit smoking about 31 years ago. She does not have any smokeless tobacco history on file. She reports that she does not drink alcohol or use illicit drugs.  Allergies: No Known Allergies  Medications Prior to Admission  Medication Sig Dispense Refill  . beta carotene w/minerals (OCUVITE) tablet Take 2 tablets by mouth 2 (two) times daily.      Joanne Nash BIOTIN PO Take 1 tablet by mouth daily.      Joanne Nash CALCIUM PO Take 2 tablets by mouth 2 (two) times daily.      . Cholecalciferol (VITAMIN D PO) Take 1 tablet by mouth 2 (two) times daily.      . citalopram (CELEXA) 20 MG tablet Take 20 mg by mouth daily.      . hydroxypropyl methylcellulose (ISOPTO TEARS) 2.5 % ophthalmic solution Place 1 drop into both eyes 3 (three) times daily as needed. Dry eyes      . levothyroxine (SYNTHROID, LEVOTHROID) 88 MCG tablet Take 88 mcg by mouth daily.      . meloxicam (MOBIC) 7.5 MG tablet Take 7.5 mg by mouth 2 (two) times daily.      . Multiple Vitamin (MULTIVITAMIN WITH MINERALS) TABS Take 1 tablet by mouth daily.      . mupirocin ointment (BACTROBAN) 2 % Place into the nose 2 (two) times daily. For Positive PCR; pt had 1 dose only on 06/14/12      . omeprazole (PRILOSEC) 20 MG capsule Take 20 mg by mouth daily.      . simvastatin (ZOCOR) 20 MG tablet Take 20 mg by mouth every evening.        No results found for this or any previous visit (from  the past 48 hour(s)). No results found.  Review of Systems  All other systems reviewed and are negative.    Blood pressure 164/74, pulse 63, temperature 98.1 F (36.7 C), temperature source Oral, resp. rate 20, SpO2 100.00%. Physical Exam  Constitutional: She is oriented to person, place, and time. She appears well-developed and well-nourished.  HENT:  Head: Atraumatic.  Eyes: EOM are normal.  Cardiovascular: Intact distal pulses.   Respiratory: Effort normal.  Musculoskeletal:       Right shoulder: She exhibits decreased range of motion, tenderness and pain.  Neurological: She is alert and oriented to person, place, and time.  Skin: Skin is warm and dry.  Psychiatric: She has a normal mood and affect.     Assessment/Plan R shoulder rotator cuff tear arthropathy Plan R shoulder reverse total shoulder replacement Risks / benefits of surgery discussed Consent on chart  NPO for OR Preop antibiotics   Joanne Nash WILLIAM 06/17/2012, 12:19 PM

## 2012-06-17 NOTE — Preoperative (Signed)
Beta Blockers   Reason not to administer Beta Blockers:Not Applicable 

## 2012-06-18 ENCOUNTER — Encounter (HOSPITAL_COMMUNITY): Payer: Self-pay | Admitting: Orthopedic Surgery

## 2012-06-18 DIAGNOSIS — M19019 Primary osteoarthritis, unspecified shoulder: Secondary | ICD-10-CM

## 2012-06-18 LAB — CBC
Hemoglobin: 10.2 g/dL — ABNORMAL LOW (ref 12.0–15.0)
MCHC: 32.3 g/dL (ref 30.0–36.0)
RDW: 13 % (ref 11.5–15.5)
WBC: 4.7 10*3/uL (ref 4.0–10.5)

## 2012-06-18 LAB — BASIC METABOLIC PANEL
GFR calc Af Amer: >90 mL/min (ref >90)
GFR calc non Af Amer: >90 mL/min (ref >90)
Potassium: 3.7 mEq/L (ref 3.5–5.1)
Sodium: 137 mEq/L (ref 135–145)

## 2012-06-18 MED ORDER — DOCUSATE SODIUM 100 MG PO CAPS: 100.0000 mg | ORAL_CAPSULE | Freq: Two times a day (BID) | ORAL | Status: DC

## 2012-06-18 MED ORDER — HYDROCODONE-ACETAMINOPHEN 5-325 MG PO TABS
1.0000 | ORAL_TABLET | ORAL | Status: DC | PRN
Start: 2012-06-18 — End: 2020-10-19

## 2012-06-18 NOTE — Progress Notes (Signed)
UR COMPLETED  

## 2012-06-18 NOTE — Progress Notes (Signed)
Occupational Therapy Evaluation Patient Details Name: Joanne Nash MRN: [id number] DOB: December 31, 1940 Today's Date: 06/18/2012 Time: 7829-5621 OT Time Calculation (min): 36 min  OT Assessment / Plan / Recommendation Clinical Impression  Pt. 71 yo female s/p reverse total shoulder. Pt. and spouse educated on elbow, wrist and finger exercises. Spouse is able to independently (A) with don and doffing sling for pt. Pt. and spouse educated on positioning for sleep, and how to dress/bath UB. Pt. will have (A) from husband when d/c home.      OT Assessment  Progress rehab of shoulder as ordered by MD at follow-up appointment    Follow Up Recommendations  No OT follow up    Barriers to Discharge      Equipment Recommendations  None recommended by OT    Recommendations for Other Services    Frequency       Precautions / Restrictions Precautions Precautions: Shoulder Type of Shoulder Precautions: No AROM Restrictions Weight Bearing Restrictions: No   Pertinent Vitals/Pain None reported     ADL  Grooming: Performed;Wash/dry hands;Supervision/safety Where Assessed - Grooming: Unsupported standing Upper Body Bathing: Performed;Maximal assistance (for RUE. ) Where Assessed - Upper Body Bathing: Unsupported sitting Upper Body Dressing: Performed;Moderate assistance Where Assessed - Upper Body Dressing: Unsupported sitting Lower Body Dressing: Performed;Minimal assistance Where Assessed - Lower Body Dressing: Unsupported sit to stand Toilet Transfer: Performed;Modified independent Toilet Transfer Method: Sit to Barista: Regular height toilet;Grab bars Toileting - Clothing Manipulation and Hygiene: Performed;Minimal assistance Where Assessed - Engineer, mining and Hygiene: Sit to stand from 3-in-1 or toilet Equipment Used: Other (comment) (blue sling) Transfers/Ambulation Related to ADLs: Pt. is supervision for transfers and ambulation for safet    ADL Comments: Pt. and spouse educated on sling wear, UB and LB dressing, and bathing the RUE. Pt spouse independent in (A) pt. with donning and doffing sling. Pt.  and spuse educated on positioning of UE for sleeping and arm exercises (AROM of elbow, wrist, fingers and neck)     OT Diagnosis:    OT Problem List:   OT Treatment Interventions:     OT Goals    Visit Information  Last OT Received On: 06/18/12 Assistance Needed: +1    Subjective Data  Subjective: I would like for you to teach me how to put my bra on Patient Stated Goal: "To get back to being myself"   Prior Functioning     Home Living Lives With: Spouse Available Help at Discharge: Family Type of Home: House Home Access: Stairs to enter Secretary/administrator of Steps: 1 step  Home Layout: One level Bathroom Shower/Tub: Health visitor: Handicapped height Home Adaptive Equipment: None Prior Function Level of Independence: Independent Driving: Yes Vocation: Retired Musician: No difficulties Dominant Hand: Right         Vision/Perception     Cognition  Overall Cognitive Status: Appears within functional limits for tasks assessed/performed Arousal/Alertness: Awake/alert Orientation Level: Appears intact for tasks assessed Behavior During Session: [facility] for tasks performed    Extremity/Trunk Assessment Trunk Assessment Trunk Assessment: Normal     Mobility Bed Mobility Bed Mobility: Supine to Sit Supine to Sit: 5: Supervision Details for Bed Mobility Assistance: Pt. requires cues not to actively move right shoulder Transfers Transfers: Sit to Stand;Stand to Sit Sit to Stand: 5: Supervision;From bed;From toilet Stand to Sit: 5: Supervision;To bed;To toilet     Shoulder Instructions Donning/doffing shirt without moving shoulder: Patient able to independently direct caregiver Method  for sponge bathing under operated UE: Patient able to independently direct  caregiver Donning/doffing sling/immobilizer: Caregiver independent with task Correct positioning of sling/immobilizer: Caregiver independent with task ROM for elbow, wrist and digits of operated UE: Independent Sling wearing schedule (on at all times/off for ADL's): Independent Proper positioning of operated UE when showering: Independent Positioning of UE while sleeping: Patient able to independently direct caregiver   Exercise Shoulder Exercises Elbow Flexion: AROM;Seated Elbow Extension: AROM;Seated Wrist Flexion: AROM;Seated Wrist Extension: AROM;Seated Digit Composite Flexion: AROM;Seated Composite Extension: AROM;Seated Neck Flexion: AROM;Seated Neck Extension: AROM;Seated Neck Lateral Flexion - Right: AROM;Seated Neck Lateral Flexion - Left: AROM;Seated   Balance     End of Session OT - End of Session Equipment Utilized During Treatment: Other (comment) (blue sling) Activity Tolerance: Patient tolerated treatment well Patient left: in chair;with call bell/phone within reach;with family/visitor present Nurse Communication: Mobility status;Precautions  GO     Cleora Fleet 06/18/2012, 10:15 AM

## 2012-06-18 NOTE — Progress Notes (Signed)
Occupational Therapy Discharge Patient Details Name: Joanne Nash MRN: [id number] DOB: [birth year] Today's Date: 06/18/2012 Time: 8295-6213 OT Time Calculation (min): 36 min  Patient discharged from OT services secondary to Pt. and spouse eductaed on UB dressing/bathing, sling wear, arm exercises for elbow, wrist and fingers. Pt. spouse will be able to (A) once home. .  Please see latest therapy progress note for current level of functioning and progress toward goals.    Progress and discharge plan discussed with patient and/or caregiver: Patient/Caregiver agrees with plan  GO     Joanne Nash 06/18/2012, 10:16 AM

## 2012-06-18 NOTE — Progress Notes (Signed)
D/C instructions and scripts given. Pt D/C home with husband.

## 2012-06-18 NOTE — Progress Notes (Signed)
PATIENT ID: Joanne Nash   1 Day Post-Op Procedure(s) (LRB): REVERSE SHOULDER ARTHROPLASTY (Right)  Subjective: Doing well, reports pain overnight but is now well controlled. Still needs to meet with OT. May feel ready to go home today.   Objective:  Filed Vitals:   06/18/12 0600  BP: 121/43  Pulse: 85  Temp: 98 F (36.7 C)  Resp: 18     Alert and Orientated x3 R UE dressings c/d/i Wiggles fingers, intact sensation to light touch distal to operation site, u/r/m nerves intact Fires deltoid Drain removed today.  Labs:  No results found for this basename: HGB:5 in the last 72 hoursNo results found for this basename: WBC:2,RBC:2,HCT:2,PLT:2 in the last 72 hoursNo results found for this basename: NA:2,K:2,CL:2,CO2:2,BUN:2,CREATININE:2,GLUCOSE:2,CALCIUM:2 in the last 72 hours  Assessment and Plan: Drain removed today Pain is well controlled, continue current pain mgmt Will plan on d/c home today, likely afternoon after OT/labs come back, patient will let nursing know if she does not feel ready. OT to show hand, wrist, elbow, sling edu CBC, BMP still pending F/u Dr. Ave Filter in 2 weeks, will d/c with Norco and Colace  VTE proph: ASA 325 BID, SCDs

## 2012-06-18 NOTE — Progress Notes (Signed)
I agree with the following treatment note after reviewing documentation.   Johnston, Coty Larsh Brynn   OTR/L Pager: [phone] Office: [phone] .   

## 2012-06-18 NOTE — Progress Notes (Signed)
I agree with the following treatment note after reviewing documentation.   Johnston, Tava Peery Brynn   OTR/L Pager: [phone] Office: [phone] .   

## 2012-06-18 NOTE — Discharge Summary (Signed)
Patient ID: Joanne Nash MRN: [id number] DOB/AGE: March 24, 1941 71 y.o.  Admit date: 06/17/2012 Discharge date: 06/18/2012  Admission Diagnoses:  Principal Problem:  *Glenohumeral arthritis   Discharge Diagnoses:  Same  Past Medical History  Diagnosis Date  . Hypothyroidism   . GERD (gastroesophageal reflux disease)   . Hyperlipemia   . OSA on CPAP   . Arthritis     "probably all over; knees, shoulders, hands" (06/17/2012)    Surgeries: Procedure(s): REVERSE SHOULDER ARTHROPLASTY on 06/17/2012   Consultants:    Discharged Condition: Improved  Hospital Course: Joanne Nash is an 71 y.o. female who was admitted 06/17/2012 for operative treatment ofGlenohumeral arthritis. Patient has severe unremitting pain that affects sleep, daily activities, and work/hobbies. After pre-op clearance the patient was taken to the operating room on 06/17/2012 and underwent  Procedure(s): REVERSE SHOULDER ARTHROPLASTY.    Patient was given perioperative antibiotics: Anti-infectives     Start     Dose/Rate Route Frequency Ordered Stop   06/17/12 1900   ceFAZolin (ANCEF) IVPB 1 g/50 mL premix        1 g 100 mL/hr over 30 Minutes Intravenous Every 6 hours 06/17/12 1651 06/18/12 0652   06/17/12 1048   ceFAZolin (ANCEF) 2-3 GM-% IVPB SOLR     Comments: TODD, ROBERT: cabinet override         06/17/12 1048 06/17/12 2259   06/16/12 1540   ceFAZolin (ANCEF) IVPB 2 g/50 mL premix        2 g 100 mL/hr over 30 Minutes Intravenous 60 min pre-op 06/16/12 1540 06/17/12 1300           Patient was given sequential compression devices, early ambulation, and ASA 325mg  BID to prevent DVT.  Patient benefited maximally from hospital stay and there were no complications.    Recent vital signs: Patient Vitals for the past 24 hrs:  BP Temp Temp src Pulse Resp SpO2  06/18/12 0600 121/43 mmHg 98 F (36.7 C) - 85  18  91 %  06/18/12 0157 120/39 mmHg 98.4 F (36.9 C) - 92  18  94 %  06/17/12 2200 121/48 mmHg  98 F (36.7 C) - 73  18  96 %  06/17/12 1615 - 97.5 F (36.4 C) - 121  18  99 %  06/17/12 1610 115/46 mmHg - - 71  19  100 %  06/17/12 1608 116/42 mmHg - - 69  16  98 %  06/17/12 1606 94/40 mmHg - - 71  20  95 %  06/17/12 1600 - - - 70  20  100 %  06/17/12 1555 96/35 mmHg - - 71  20  100 %  06/17/12 1548 - 97.5 F (36.4 C) - - - -  06/17/12 1545 - - - 72  19  98 %  06/17/12 1541 96/36 mmHg - - 74  20  100 %  06/17/12 1530 - - - 76  17  100 %  06/17/12 1526 107/32 mmHg - - 79  20  100 %  06/17/12 1515 - - - 80  13  100 %  06/17/12 1512 - - - - 23  -  06/17/12 1511 124/47 mmHg - - - 26  -  06/17/12 1508 - 98.5 F (36.9 C) - - - -  06/17/12 1220 - - - 74  20  100 %  06/17/12 1219 - - - 66  20  100 %  06/17/12 1153 - - - 63  20  100 %  06/17/12 1152 - - - 66  20  100 %  06/17/12 1033 164/74 mmHg 98.1 F (36.7 C) Oral 69  18  96 %     Recent laboratory studies: No results found for this basename: WBC:2,HGB:2,HCT:2,PLT:2,NA:2,K:2,CL:2,CO2:2,BUN:2,CREATININE:2,GLUCOSE:2,PT:2,INR:2,CALCIUM,2: in the last 72 hours   Discharge Medications:     Medication List     As of 06/18/2012  7:47 AM    STOP taking these medications         meloxicam 7.5 MG tablet   Commonly known as: MOBIC      TAKE these medications         beta carotene w/minerals tablet   Take 2 tablets by mouth 2 (two) times daily.      BIOTIN PO   Take 1 tablet by mouth daily.      CALCIUM PO   Take 2 tablets by mouth 2 (two) times daily.      citalopram 20 MG tablet   Commonly known as: CELEXA   Take 20 mg by mouth daily.      docusate sodium 100 MG capsule   Commonly known as: COLACE   Take 1 capsule (100 mg total) by mouth 2 (two) times daily.      HYDROcodone-acetaminophen 5-325 MG per tablet   Commonly known as: NORCO/VICODIN   Take 1-2 tablets by mouth every 4 (four) hours as needed.      hydroxypropyl methylcellulose 2.5 % ophthalmic solution   Commonly known as: ISOPTO TEARS   Place 1 drop  into both eyes 3 (three) times daily as needed. Dry eyes      levothyroxine 88 MCG tablet   Commonly known as: SYNTHROID, LEVOTHROID   Take 88 mcg by mouth daily.      multivitamin with minerals Tabs   Take 1 tablet by mouth daily.      mupirocin ointment 2 %   Commonly known as: BACTROBAN   Place into the nose 2 (two) times daily. For Positive PCR; pt had 1 dose only on 06/14/12      omeprazole 20 MG capsule   Commonly known as: PRILOSEC   Take 20 mg by mouth daily.      simvastatin 20 MG tablet   Commonly known as: ZOCOR   Take 20 mg by mouth every evening.      VITAMIN D PO   Take 1 tablet by mouth 2 (two) times daily.        Diagnostic Studies: Dg Chest 2 View  06/14/2012  *RADIOLOGY REPORT*  Clinical Data: Preop.  CHEST - 2 VIEW  Comparison: None  Findings: Heart and mediastinal contours are within normal limits. No focal opacities or effusions.  No acute bony abnormality.  IMPRESSION: No active cardiopulmonary disease.   Original Report Authenticated By: Cyndie Chime, M.D.    Dg Shoulder Right Port  06/17/2012  *RADIOLOGY REPORT*  Clinical Data: Osteoarthritis of the right shoulder.  PORTABLE RIGHT SHOULDER - 2+ VIEW  Comparison: None.  Findings: Reverse shoulder arthroplasty has been performed. Components appear in good position.  Soft tissue drain in place.  IMPRESSION: Satisfactory AP appearance of the left shoulder after reverse shoulder arthroplasty.   Original Report Authenticated By: Gwynn Burly, M.D.     Disposition: Final discharge disposition not confirmed      Discharge Orders    Future Orders Please Complete By Expires   Diet - low sodium heart healthy      Call MD / Call 911  Comments:   If you experience chest pain or shortness of breath, CALL 911 and be transported to the hospital emergency room.  If you develope a fever above 101 F, pus (white drainage) or increased drainage or redness at the wound, or calf pain, call your surgeon's office.     Constipation Prevention      Comments:   Drink plenty of fluids.  Prune juice may be helpful.  You may use a stool softener, such as Colace (over the counter) 100 mg twice a day.  Use MiraLax (over the counter) for constipation as needed.   Increase activity slowly as tolerated      Driving restrictions      Comments:   No driving until cleared by physician.   Lifting restrictions      Comments:   No lifting until cleared by physician.      Follow-up Information    Follow up with Mable Paris, MD. Schedule an appointment as soon as possible for a visit in 2 weeks.   Contact information:   [facility] [address] [place] [address] [phone]           Signed: Jiles Harold 06/18/2012, 7:47 AM

## 2012-07-29 ENCOUNTER — Encounter: Payer: Self-pay | Admitting: Orthopedic Surgery

## 2012-08-18 ENCOUNTER — Encounter: Payer: Self-pay | Admitting: Orthopedic Surgery

## 2012-09-12 ENCOUNTER — Ambulatory Visit: Payer: Self-pay | Admitting: Internal Medicine

## 2012-09-18 ENCOUNTER — Encounter: Payer: Self-pay | Admitting: Orthopedic Surgery

## 2013-03-31 ENCOUNTER — Ambulatory Visit: Payer: Self-pay | Admitting: Unknown Physician Specialty

## 2013-09-15 ENCOUNTER — Ambulatory Visit: Payer: Self-pay | Admitting: Internal Medicine

## 2014-09-01 ENCOUNTER — Ambulatory Visit: Payer: Self-pay | Admitting: General Practice

## 2014-09-01 LAB — BASIC METABOLIC PANEL
Anion Gap: 4 — ABNORMAL LOW (ref 7–16)
BUN: 20 mg/dL — AB (ref 7–18)
CHLORIDE: 102 mmol/L (ref 98–107)
CO2: 33 mmol/L — AB (ref 21–32)
Calcium, Total: 9.4 mg/dL (ref 8.5–10.1)
Creatinine: 0.76 mg/dL (ref 0.60–1.30)
Glucose: 85 mg/dL (ref 65–99)
OSMOLALITY: 279 (ref 275–301)
POTASSIUM: 4.4 mmol/L (ref 3.5–5.1)
SODIUM: 139 mmol/L (ref 136–145)

## 2014-09-01 LAB — URINALYSIS, COMPLETE
BACTERIA: NONE SEEN
BLOOD: NEGATIVE
Bilirubin,UR: NEGATIVE
GLUCOSE, UR: NEGATIVE mg/dL (ref 0–75)
Ketone: NEGATIVE
Nitrite: NEGATIVE
PROTEIN: NEGATIVE
Ph: 7 (ref 4.5–8.0)
RBC,UR: <1 /HPF (ref 0–5)
Specific Gravity: 1.011 (ref 1.003–1.030)
WBC UR: 4 /HPF (ref 0–5)

## 2014-09-01 LAB — CBC
HCT: 44.8 % (ref 35.0–47.0)
HGB: 14.7 g/dL (ref 12.0–16.0)
MCH: 32.3 pg (ref 26.0–34.0)
MCHC: 32.9 g/dL (ref 32.0–36.0)
MCV: 98 fL (ref 80–100)
Platelet: 176 10*3/uL (ref 150–440)
RBC: 4.57 10*6/uL (ref 3.80–5.20)
RDW: 13.4 % (ref 11.5–14.5)
WBC: 7 10*3/uL (ref 3.6–11.0)

## 2014-09-01 LAB — SEDIMENTATION RATE: Erythrocyte Sed Rate: 10 mm/hr (ref 0–30)

## 2014-09-01 LAB — PROTIME-INR
INR: 0.9
PROTHROMBIN TIME: 12.5 s (ref 11.5–14.7)

## 2014-09-01 LAB — MRSA PCR SCREENING

## 2014-09-01 LAB — APTT: Activated PTT: 23.9 secs (ref 23.6–35.9)

## 2014-09-02 LAB — URINE CULTURE

## 2014-09-14 ENCOUNTER — Inpatient Hospital Stay: Payer: Self-pay | Admitting: General Practice

## 2014-09-15 LAB — BASIC METABOLIC PANEL
Anion Gap: 6 — ABNORMAL LOW (ref 7–16)
BUN: 10 mg/dL (ref 7–18)
CHLORIDE: 105 mmol/L (ref 98–107)
CREATININE: 0.67 mg/dL (ref 0.60–1.30)
Calcium, Total: 8.3 mg/dL — ABNORMAL LOW (ref 8.5–10.1)
Co2: 27 mmol/L (ref 21–32)
EGFR (African American): >60
GLUCOSE: 122 mg/dL — AB (ref 65–99)
OSMOLALITY: 276 (ref 275–301)
Potassium: 3.9 mmol/L (ref 3.5–5.1)
SODIUM: 138 mmol/L (ref 136–145)

## 2014-09-15 LAB — PLATELET COUNT: Platelet: 136 10*3/uL — ABNORMAL LOW (ref 150–440)

## 2014-09-15 LAB — HEMOGLOBIN: HGB: 12.3 g/dL (ref 12.0–16.0)

## 2014-09-16 ENCOUNTER — Encounter: Payer: Self-pay | Admitting: Internal Medicine

## 2014-09-16 LAB — BASIC METABOLIC PANEL
Anion Gap: 5 — ABNORMAL LOW (ref 7–16)
BUN: 4 mg/dL — AB (ref 7–18)
CHLORIDE: 103 mmol/L (ref 98–107)
CO2: 30 mmol/L (ref 21–32)
Calcium, Total: 8.3 mg/dL — ABNORMAL LOW (ref 8.5–10.1)
Creatinine: 0.64 mg/dL (ref 0.60–1.30)
EGFR (African American): >60
EGFR (Non-African Amer.): >60
GLUCOSE: 101 mg/dL — AB (ref 65–99)
OSMOLALITY: 273 (ref 275–301)
POTASSIUM: 3.9 mmol/L (ref 3.5–5.1)
SODIUM: 138 mmol/L (ref 136–145)

## 2014-09-16 LAB — PLATELET COUNT: Platelet: 128 10*3/uL — ABNORMAL LOW (ref 150–440)

## 2014-09-16 LAB — HEMOGLOBIN: HGB: 12.5 g/dL (ref 12.0–16.0)

## 2014-09-18 ENCOUNTER — Encounter: Payer: Self-pay | Admitting: Internal Medicine

## 2014-10-19 ENCOUNTER — Encounter: Payer: Self-pay | Admitting: Internal Medicine

## 2014-10-21 ENCOUNTER — Ambulatory Visit: Payer: Self-pay | Admitting: Internal Medicine

## 2014-11-06 ENCOUNTER — Ambulatory Visit: Payer: Self-pay | Admitting: General Practice

## 2014-11-17 ENCOUNTER — Encounter
Admit: 2014-11-17 | Disposition: A | Discharge status: Home / Self Care | Payer: Self-pay | Attending: Internal Medicine | Admitting: Internal Medicine

## 2014-12-18 ENCOUNTER — Encounter
Admit: 2014-12-18 | Disposition: A | Discharge status: Home / Self Care | Payer: Self-pay | Attending: Internal Medicine | Admitting: Internal Medicine

## 2015-01-10 NOTE — Discharge Summary (Signed)
PATIENT NAME:  Joanne Nash, Joanne Nash MR#:  [id number] DATE OF BIRTH:  1941-02-14  DATE OF ADMISSION:  03/11/2012 DATE OF DISCHARGE:  03/14/2012  ADMITTING DIAGNOSIS: Degenerative arthrosis of the right knee.   DISCHARGE DIAGNOSIS: Degenerative arthrosis of the right knee.   HISTORY: The patient is a 74 year old who is followed at [facility] for progression of right knee pain. The patient had reported progressive right knee pain, especially along the lateral aspect of the knee. It apparently was noted to be aggravated with weight-bearing activities. At the time of surgery, she was not using any type of ambulatory aid. She had not seen any significant improvement in her condition despite antiinflammatory medications as well as Synvisc injections. She had reported start-up stiffness as well as increased pain with stair ambulation. Her pain had progressed to the point that it was significantly interfering with her activities of daily living. X-rays taken in [facility] showed bone-on-bone to the lateral compartment space. She was noted to have a slight valgus deformity. Some erosion of the tibial plateau was noted. There was subchondral sclerosis as well as some osteophyte formation. After discussion of the risks and benefits of surgical intervention, the patient expressed her understanding of the risks and benefits and agreed with plans for surgical intervention.   PROCEDURE: Right total knee arthroplasty using computer-assisted navigation.   ANESTHESIA: Femoral nerve block with spinal.   SOFT TISSUE RELEASE: Anterior cruciate ligament, posterior cruciate ligament, deep medial collateral ligament, and patellofemoral ligament, as well as the posterior lateral corner.  IMPLANTS UTILIZED: DePuy PFC Sigma size 2.5 posterior stabilized femoral component (cemented), size 2 MBT tibial component (cemented), 35 mm three pegged oval dome patella (cemented), and a 10 mm stabilized rotating platform  polyethylene insert.   HOSPITAL COURSE: The patient tolerated the procedure very well. She had no complications. She was then taken to the PAC-U where she was stabilized and then transferred to the orthopedic floor. She began receiving anticoagulation therapy of Lovenox 30 mg subcutaneous every 12 hours per anesthesia and pharmacy protocol. She was fitted with TED stockings bilaterally. These were allowed to be removed one hour per eight hour shift. The right one was applied on day two following removal of the Hemovac and dressing change. The patient was also fitted with AVI compression foot pumps bilaterally set at 80 mmHg. The patient has denied any evidence of deep vein thrombosis to the lower extremity. Heels were elevated off the bed using rolled towels.   The patient has denied any chest pain or shortness of breath. The patient's vital signs have been stable. She has been afebrile. Hemodynamically she was stable and no transfusions were given other than the Autovac transfusion given the first 6 hours postoperatively.   Physical therapy was initiated on day one for gait training and transfers. Upon being discharged, she was ambulating greater than 200 feet. She was able to go up and down four sets of steps. She was independent with bed to chair transfers. Occupational therapy was also initiated on day one for activities of daily living and assistive devices.   The patient's IV, Foley and Hemovac were discontinued on day two along with a dressing change. The wound was free of any drainage or any signs of infection. Polar Care was reapplied to the surgical leg maintaining a temperature of 40 to 50 degrees Fahrenheit.   DISPOSITION: The patient is being discharged home in improved stable condition.   DISCHARGE INSTRUCTIONS: 1. She will continue weight-bearing as tolerated. She  is to continue using a walker until cleared by physical therapy to go to a quad cane. She will receive home health physical  therapy for two weeks and then outpatient therapy.  2. She is to followup in the clinic in two weeks. She is to call the clinic sooner if any temperatures of 101.5 or greater or excessive bleeding.  3. She is placed on a regular diet.  4. She is to continue wearing the TED stockings. These are to be worn during the day but may be removed at night.  5. Recommend that she use the Polar Care pretty much around-the-clock as much as possible for the first two weeks.  6. She is to resume her regular medication that she was on prior to admission. She was given a prescription for Norco 1 to 2 tablets every 4 to 6 hours p.r.n. for pain as well as Ultram 1 to 2 tablets every 4 to 6 hours p.r.n. for pain. A third prescription of Lovenox 40 mg subcutaneously daily for 14 days and then discontinue and begin taking one 81 mg enteric-coated aspirin was given.    PAST MEDICAL HISTORY:  1. Chickenpox.  2. Sleep apnea.  3. Osteoporosis.  4. Hypothyroidism.  5. Shingles.  6. Hyperlipidemia.  7. Arthritis.  8. Varicose veins of bilateral lower extremities.  9. Esophageal strictures.  ____________________________ Vance Peper, PA [email or web address] D: 03/14/2012 07:46:55 ET T: 03/14/2012 16:15:09 ET JOB#: [id number]  cc: Vance Peper, PA, <Dictator> [place] PA ELECTRONICALLY SIGNED 03/16/2012 21:09

## 2015-01-10 NOTE — Op Note (Signed)
PATIENT NAME:  Joanne Nash, Joanne Nash MR#:  [id number] DATE OF BIRTH:  09/16/1941  DATE OF PROCEDURE:  03/11/2012  PREOPERATIVE DIAGNOSIS: Degenerative arthrosis of the right knee.   POSTOPERATIVE DIAGNOSIS: Degenerative arthrosis of the right knee.   PROCEDURE PERFORMED: Right total knee arthroplasty using computer-assisted navigation.   SURGEON: Laurice Record. Holley Bouche., MD    ASSISTANT: Vance Peper, PA-C (required to maintain retraction throughout the procedure)   ANESTHESIA: Femoral nerve block and spinal.   ESTIMATED BLOOD LOSS: 100 mL.   FLUIDS REPLACED: 1200 mL of Crystalloid.   TOURNIQUET TIME: 87 minutes.   DRAINS: Two medium drains to reinfusion system.   SOFT TISSUE RELEASES: Anterior cruciate ligament, posterior cruciate ligament, deep medial collateral ligament, patellofemoral ligament, and posterolateral corner.   IMPLANTS UTILIZED: DePuy PFC Sigma size 2.5 posterior stabilized femoral component (cemented), size 2 MBT tibial component (cemented), 35 mm three peg oval dome patella (cemented), and a 10 mm stabilized rotating platform polyethylene insert.   INDICATIONS FOR SURGERY: The patient is a 74 year old female who has been seen for complaints of progressive right knee pain. X-rays demonstrated severe degenerative changes with valgus deformity. After discussion of the risks and benefits of surgical intervention, the patient expressed her understanding of the risks and benefits and agreed with plans for surgical intervention.   PROCEDURE IN DETAIL: The patient was brought in the operating room and, after adequate femoral nerve block and spinal anesthesia was achieved, a tourniquet was placed on the patient's upper right thigh. The patient's right knee and leg were cleaned and prepped with alcohol and DuraPrep and draped in the usual sterile fashion. A "time-out" was performed as per usual protocol. The right lower extremity was exsanguinated using an Esmarch, and the tourniquet was  inflated to 300 mmHg. Anterior longitudinal incision was made followed by a standard mid vastus approach. A large effusion was evacuated. The deep fibers of the medial collateral ligament were elevated in a subperiosteal fashion off the medial flare of the tibia so as to maintain a continuous soft tissue sleeve. Patella was subluxed laterally and the patellofemoral ligament was incised. Inspection of the knee demonstrated severe degenerative changes with evidence of eburnated bone to the lateral compartment. Prominent osteophytes were debrided using a rongeur. Anterior and posterior cruciate ligaments were excised. Two 4.0 mm Schanz pins were inserted into the femur and into the tibia for attachment of the array of trackers used for computer-assisted navigation. Hip center was identified using circumduction technique. Distal landmarks were mapped using the computer. Distal femur and proximal tibia were mapped using the computer. Distal femoral cutting guide was positioned using computer-assisted navigation so as to achieve a 5 degree distal valgus cut. Cut was performed and verified using the computer. Distal femur was sized and it was felt that a size 2.5 femoral component was appropriate. A size 2.5 cutting guide was positioned and the anterior cut was performed and verified using the computer. This was followed by completion of the posterior and chamfer cuts. Femoral cutting guide for the central box was then positioned and the central box cut was performed.   Attention was then directed to the proximal tibia. Medial and lateral menisci were excised. The extramedullary tibial cutting guide was positioned using computer-assisted navigation so as to achieve a 0 degree varus valgus alignment and 0 degree posterior slope. Cut was performed and verified using the computer. Proximal tibia was sized and it was felt that a size 2 tibial tray was appropriate. Tibial and femoral trials  were inserted followed by insertion  of a 10 mm polyethylene insert. The knee was felt to be tight laterally. Trial implants were removed and the knee was placed in extension with Moreland retractors used to distract the joint. The posterolateral corner was noted to be extremely tight. This was carefully released using a combination of electrocautery and Metzenbaum scissors. Trial components were inserted and 10 mm polyethylene insert was placed. Excellent mediolateral soft tissue balancing was appreciated both in full extension and in 90 degrees of flexion. Finally, the patella was cut and prepared so as to accommodate a 35 mm three peg oval dome patella. Patellar trial was placed and the knee was placed through a range of motion with excellent patellar tracking appreciated.   Femoral trial was removed. Central post hole for the tibial component was reamed followed by insertion of a keel punch. Tibial trials were then removed. Cut surfaces of bone were irrigated with copious amounts of normal saline with antibiotic solution using pulsatile lavage and then suctioned dry. Polymethyl methacrylate cement was prepared in the usual fashion using a vacuum mixer. Cement was applied to the cut surface of the proximal tibia as well as along the undersurface of a size 2 MBT tibial component. Tibial component was positioned and impacted into place. Excess cement was removed using freer elevators. Cement was then applied to the cut surface of the femur as well as along the posterior flanges of a size 2.5 posterior stabilized femoral component. Femoral component was positioned and impacted into place. Excess cement was removed using freer elevators. A 10 mm polyethylene trial was inserted and the knee was brought in full extension with steady axial compression applied. Finally, cement was applied to the backside of a 35 mm three peg oval dome patella and patellar component was positioned and patellar clamp applied. Excess cement was removed using freer elevators.    After adequate curing of cement, tourniquet was deflated after a total tourniquet time of 87 minutes. Hemostasis was achieved using electrocautery. The knee was then irrigated with copious amounts of normal saline with antibiotic solution using pulsatile lavage and then suctioned dry. The knee was inspected for any residual cement debris. 30 mL of 0.25% Marcaine with epinephrine was injected along the posterior capsule. A 10 mm stabilized rotating platform polyethylene insert was inserted and the knee was placed through a range of motion. Excellent mediolateral soft tissue balancing was appreciated as well as excellent patellar tracking noted. Two medium drains were placed in the wound bed and brought out through a separate stab incision to be attached to a reinfusion system. The medial parapatellar portion of the incision was reapproximated using interrupted sutures of #1 Vicryl. Subcutaneous tissue was approximated in layers using first #0 Vicryl followed by #2-0 Vicryl. Skin was closed with skin staples. Sterile dressing was applied.   The patient tolerated the procedure well. She was transported to the recovery room in stable condition.   ____________________________ Laurice Record. Holley Bouche., MD [email or web address] D: 03/11/2012 18:38:05 ET T: 03/12/2012 09:17:46 ET JOB#: [id number]  cc: Jeneen Rinks P. Holley Bouche., MD, <Dictator> JAMES P Holley Bouche MD ELECTRONICALLY SIGNED 03/14/2012 22:42

## 2015-01-13 NOTE — Op Note (Signed)
PATIENT NAME:  Joanne Nash, Joanne Nash MR#:  [id number] DATE OF BIRTH:  May 16, 1941  DATE OF PROCEDURE:  09/14/2014  PREOPERATIVE DIAGNOSIS: Degenerative arthrosis of the left knee (primary).   POSTOPERATIVE DIAGNOSIS: Degenerative arthrosis of the left knee (primary).   PROCEDURE PERFORMED: Left total knee arthroplasty using computer-assisted navigation.   SURGEON: Laurice Record. Holley Bouche., MD   ASSISTANT: Vance Peper, PA (required to maintain retraction throughout the procedure).   ANESTHESIA: Spinal.   ESTIMATED BLOOD LOSS: 50 mL.   FLUIDS REPLACED: 1500 mL of crystalloid.  TOURNIQUET TIME: 88 minutes.   DRAINS: Two medium drains to reinfusion system.  SOFT TISSUE RELEASES: Anterior cruciate ligament, posterior cruciate ligament, deep medial collateral ligament, patellofemoral ligament, and posterolateral corner.  IMPLANTS UTILIZED: DePuy PFC Sigma size 2.5 posterior stabilized femoral component (cemented), size 2.5 MBT tibial component (cemented), 35 mm 3-peg oval-dome patella (cemented), and a 10 mm stabilized rotating platform polyethylene insert.   INDICATIONS FOR SURGERY: The patient is a 74 year old female who has been seen for complaints of progressive left knee pain. X-rays demonstrated severe degenerative changes in tricompartmental fashion with relative valgus deformity. After discussion of the risks and benefits of surgical intervention, the patient expressed understanding of the risks, benefits, and agreed with plans for surgical intervention.   PROCEDURE IN DETAIL: The patient was brought to the Operating Room and, after adequate spinal anesthesia was achieved, a tourniquet was placed on the patient's upper left thigh. The patient's left knee and leg were cleaned and prepped with alcohol and DuraPrep and draped in the usual sterile fashion. A "timeout" was performed as per usual protocol. The left lower extremity was exsanguinated using an Esmarch, and the tourniquet was inflated to 300  mmHg. An anterior longitudinal incision was made followed by a standard mid vastus approach. A large effusion was evacuated. The deep fibers of the medial collateral ligament were elevated in a subperiosteal fashion off the medial flare of the tibia so as to maintain a continuous soft tissue sleeve. The patella was subluxed laterally and the patellofemoral ligament was incised. Inspection of the knee demonstrated severe degenerative changes, most notably to the lateral compartment. Prominent osteophytes were debrided using a rongeur. Anterior and posterior cruciate ligaments were excised. Two 4.0 mm Schanz pins were inserted into the femur and into the tibia for attachment of the array of trackers used for computer-assisted navigation. Hip center was identified using circumduction technique. Distal landmarks were mapped using the computer. The distal femur and proximal tibia were mapped using the computer. Distal femoral cutting guide was positioned using computer-assisted navigation so as to achieve a 5-degree distal valgus cut. Cut was performed and verified using the computer. Distal femur was sized and it was felt that a size 2.5 femoral component was appropriate. A size 2.5 cutting guide was positioned and an anterior cut was performed and verified using the computer. This was followed by completion of the posterior and chamfer cuts. Femoral cutting guide for the central box was then positioned and the central box cut was performed. Attention was then directed to the proximal tibia. Medial and lateral menisci were excised. The extramedullary tibial cutting guide was positioned using computer-assisted navigation so as to achieve 0-degree varus valgus alignment and 0-degree posterior slope. Cut was performed and verified using the computer. The proximal tibia was sized and it was felt that a size 2.5 tibial tray was appropriate. Tibial and femoral trials were inserted, followed by insertion of a 10 mm  polyethylene trial. The knee was  felt to be tight laterally. Trial components were removed and knee was placed in extension. Moreland retractors were used to distract the joint. The posterolateral corner was carefully released using a combination of electrocautery and Metzenbaum scissors. This allowed for appropriate soft tissue balancing. Trial components were reinserted and the knee was felt to be balanced both in flexion and in extension. Finally, the patella was cut and repaired so as to accommodate a 35 mm 3-peg oval-dome patella. Patellar trial was placed and the knee was placed through a range of motion with excellent patellar tracking appreciated. The femoral trial was removed. A central post hole for the tibial component was reamed, followed by insertion of a keel punch. Tibial trials were then removed. Cut surfaces of bone were irrigated with copious amounts of normal saline with antibiotic solution using pulsatile lavage and then suctioned dry. Polymethyl methacrylate cement was prepared in the usual fashion using a vacuum mixer. Cement was applied to the cut surface of the proximal tibia as well as along the undersurface of a size 2.5 MBT tibial component. The tibial component was positioned and impacted into place. Excess cement was removed using freer elevators. Cement was then applied to the cut surface of the femur as well as on the posterior flanges of a size 2.5 posterior stabilized femoral component. Femoral component was positioned and impacted into place. Excess cement was removed using freer elevators. A 10 mm polyethylene trial was inserted and the knee was brought into full extension with steady axial compression applied. Finally, cement was applied to the backside of a 35 mm 3-peg oval-dome patella and patellar component was positioned and a patellar clamp applied. Excess cement was removed using freer elevators.  After adequate curing of cement, the tourniquet was deflated after a total  tourniquet time of 88 minutes. Hemostasis was achieved using electrocautery. The knee was irrigated with copious amounts of normal saline with antibiotic solution using pulsatile lavage and then suctioned dry. The knee was inspected for any residual cement debris. Then, 20 mL of 1.3% Exparel in 40 mL of normal saline was injected along the posterior capsule, medial and lateral gutters, and along the arthrotomy site. A 10 mm stabilized rotating platform polyethylene insert was inserted and the knee was placed through a range of motion with excellent medial and lateral soft tissue balancing both in full extension and in flexion. Good patellar tracking was noted. Two medium drains were placed in the wound bed and brought out through a separate stab incision to be attached to a reinfusion system. The medial parapatellar portion of the incision was reapproximated using interrupted sutures of #1 Vicryl. The subcutaneous tissue was injected with 30 mL of 0.25% Marcaine with epinephrine. The subcutaneous tissue was approximated in layers using first #0 Vicryl followed by #2-0 Vicryl. Skin was closed with skin staples. A sterile dressing was applied.  The patient tolerated the procedure well. She was transported to the recovery room in stable condition.   ____________________________ Laurice Record. Holley Bouche., MD jph:ST D: 09/14/2014 20:59:33 ET T: 09/14/2014 22:27:34 ET JOB#: [id number]  cc: Laurice Record. Holley Bouche., MD, <Dictator> JAMES P Holley Bouche MD ELECTRONICALLY SIGNED 10/05/2014 0:45

## 2015-01-13 NOTE — Discharge Summary (Signed)
PATIENT NAME:  Joanne Nash, Joanne Nash MR#:  [id number] DATE OF BIRTH:  [birth year]  DATE OF ADMISSION:  09/14/2014 DATE OF DISCHARGE:  09/17/2014  ADMITTING DIAGNOSIS: Degenerative arthrosis of left knee.   DISCHARGE DIAGNOSIS: Degenerative arthrosis of left knee.   HISTORY: The patient is a 74 year old female who has been followed at [facility] for progression of left knee pain. She had a long history of progressive left knee pain. She had localized most of the pain along the lateral aspect of the knee. Her pain was aggravated with weight-bearing activities as well as stair ambulation. At the time of surgery, she was not using any ambulatory aid. The patient had not appreciated any significant improvement in her condition despite intra-articular cortisone injections, Synvisc injections, anti-inflammatory medications as well as activity modification. The left knee pain had progressed to the point that it was significantly interfering with her activities of daily living. X-rays taken in [facility] showed narrowing of the lateral cartilage space with bone-on-bone being noted. It was also associated with valgus alignment. Osteophyte and subchondral sclerosis was noted. After discussion of the risks and benefits of surgical intervention, the patient expressed her understanding of the risks and benefits and agreed for plans for surgical intervention.   PROCEDURE: Left total knee arthroplasty using computer-assisted navigation.   ANESTHESIA: Spinal.   SOFT TISSUE RELEASE: Anterior cruciate ligament, posterior cruciate ligament, deep medial collateral ligament, patellofemoral ligament as well as posterolateral corner.  IMPLANTS UTILIZED: DePuy PFC Sigma size 2.5 posterior stabilized femoral component (cemented), size 2.5 MBT tibial component (cemented), 35 mm three-pegged oval dome patella (cemented), and a 10 mm stabilized rotating platform polyethylene insert.   HOSPITAL COURSE: The patient tolerated the  procedure very well. She had no complications. She was then taken to the PAC-U where she was stabilized and then transferred to the orthopedic floor. She began receiving anticoagulation therapy of Lovenox 30 mg subcu every hours per anesthesia and pharmacy protocol. She was fitted with TED stockings bilaterally. These were allowed to be removed 1 hour per 8 hour shift. The left one was applied on day 2 following removal of the Hemovac and dressing change. The patient was also fitted with the AVI compression foot pumps bilaterally set at 80 mmHg. Her calves have been nontender. She has been afebrile. Hemodynamically she was stable and no transfusions were given other than the Autovac transfusion given the first 6 hours postoperatively.   The patient began receiving physical therapy on day 1 for gait training and transfers. She has had slow progression; however, she has had no complications. She has overall done very well. Occupational therapy was also initiated on day 1 for ADL and assistive devices.   The patient's IV, Foley and Hemovac were DC'd on day 2 along with a dressing change. The wound was free of any drainage or signs of infection. Polar Care was reapplied to the surgical leg maintaining a temperature of 40 to 50 degrees Fahrenheit.   DISPOSITION: The patient is being discharged to skilled nursing facility in improved stable condition.   DISCHARGE INSTRUCTIONS: She will continue weight-bearing as tolerated. Continue using a rolling walker until cleared by physical therapy to go to a quad cane. Elevate the heels off the bed. Continue TED stockings bilaterally. These are allowed to be removed 1 hour per 8 hour shift. Incentive spirometer q. 1 hour while awake and encourage the patient to cough and deep breathing q. 2 hours while awake. She is placed on a regular diet. Continue Polar  Care maintaining a temperature of 40 to 50 degrees Fahrenheit. She was instructed in wound care. The wound is not to  get wet until the staples are removed. She has a follow-up appointment with [facility] on January 12th at 9:45, sooner if any complications. In the meantime, she will continue PT for gait training and transfers, OT for ADL and assistive devices.   MEDICATIONS: Calcium citrate 1 tablet b.i.d., vitamin D3 1,000 units q. 12 hours, Restasis 1 drop both eyes q. 12 hours, Senokot-S 1 tablet b.i.d., Synthroid 0.088 mg q. 6 hours, multivitamin tablet 1 per day, pantoprazole 40 mg b.i.d., Zocor 20 mg at bedtime, Lovenox 40 mg daily for 14 days and then discontinue and begin taking one 81 mg enteric-coated aspirin, Tylenol ES 500 to 1000 mg q. 4 hours p.r.n., Mylanta DS 30 mL q. 6 hours p.r.n., Dulcolax suppositories 10 mg rectally p.r.n. if no results with milk of magnesia or Dulcolax, milk of magnesia 30 mL b.i.d., Roxicodone 5 to 10 mg q. 4 to 6 hours p.r.n. for pain, tramadol 50 to 100 mg q. 4 to 6 hours p.r.n. for pain, enema soapsuds if no results with milk of magnesia or Dulcolax.   PAST MEDICAL HISTORY: Hypothyroidism, osteoporosis, hyperlipidemia, granuloma annulare of the legs, esophageal stricture, obstructive sleep apnea on CPAP, history of shingles, varicose veins of bilateral lower extremities.  ____________________________ Vance Peper, PA jrw:sb D: 09/17/2014 07:09:36 ET T: 09/17/2014 07:33:12 ET JOB#: [id number]  cc: Vance Peper, PA, <Dictator> JON WOLFE PA ELECTRONICALLY SIGNED 09/22/2014 13:49

## 2015-03-02 ENCOUNTER — Other Ambulatory Visit: Payer: Self-pay | Admitting: Neurosurgery

## 2015-03-24 ENCOUNTER — Encounter (HOSPITAL_COMMUNITY): Payer: Self-pay

## 2015-03-24 ENCOUNTER — Encounter (HOSPITAL_COMMUNITY)
Admission: RE | Admit: 2015-03-24 | Discharge: 2015-03-24 | Disposition: A | Discharge status: Home / Self Care | Payer: PPO | Source: Ambulatory Visit | Attending: Neurosurgery | Admitting: Neurosurgery

## 2015-03-24 DIAGNOSIS — Z01812 Encounter for preprocedural laboratory examination: Secondary | ICD-10-CM | POA: Diagnosis present

## 2015-03-24 LAB — BASIC METABOLIC PANEL
ANION GAP: 8 (ref 5–15)
BUN: 12 mg/dL (ref 6–20)
CHLORIDE: 106 mmol/L (ref 101–111)
CO2: 27 mmol/L (ref 22–32)
Calcium: 9.3 mg/dL (ref 8.9–10.3)
Creatinine, Ser: 0.58 mg/dL (ref 0.44–1.00)
Glucose, Bld: 89 mg/dL (ref 65–99)
Potassium: 4.4 mmol/L (ref 3.5–5.1)
Sodium: 141 mmol/L (ref 135–145)

## 2015-03-24 LAB — CBC
HCT: 43.2 % (ref 36.0–46.0)
HEMOGLOBIN: 14.4 g/dL (ref 12.0–15.0)
MCH: 31.6 pg (ref 26.0–34.0)
MCHC: 33.3 g/dL (ref 30.0–36.0)
MCV: 94.9 fL (ref 78.0–100.0)
Platelets: 146 10*3/uL — ABNORMAL LOW (ref 150–400)
RBC: 4.55 MIL/uL (ref 3.87–5.11)
RDW: 12.4 % (ref 11.5–15.5)
WBC: 4.9 10*3/uL (ref 4.0–10.5)

## 2015-03-24 LAB — SURGICAL PCR SCREEN
MRSA, PCR: NEGATIVE
STAPHYLOCOCCUS AUREUS: POSITIVE — AB

## 2015-03-24 LAB — TYPE AND SCREEN
ABO/RH(D): B POS
ANTIBODY SCREEN: NEGATIVE

## 2015-03-24 NOTE — Pre-Procedure Instructions (Addendum)
Joanne Nash  03/24/2015      [id number] Jo[place] Nash, [facility] [place] [address] Phone: [phone] Fax: [phone]    Your procedure is scheduled on 04/01/15  Report to [facility] cone short stay admitting at 700 A.M.  Call this number if you have problems the morning of surgery:  [phone]   Remember:  Do not eat food or drink liquids after midnight.  Take these medicines the morning of surgery with A SIP OF WATER levothyroxine, prilosec, pain med if needed    STOP all herbel meds, nsaids (aleve,naproxen,advil,ibuprofen) 5 days prior to surgery starting 03/27/15 including biotin, calcium, vit D, multi vitamins,meloxicam, aspirin   Do not wear jewelry, make-up or nail polish.  Do not wear lotions, powders, or perfumes.  You may wear deodorant.  Do not shave 48 hours prior to surgery.  Men may shave face and neck.  Do not bring valuables to the hospital.  [facility] is not responsible for any belongings or valuables.  Contacts, dentures or bridgework may not be worn into surgery.  Leave your suitcase in the car.  After surgery it may be brought to your room.  For patients admitted to the hospital, discharge time will be determined by your treatment team.  Patients discharged the day of surgery will not be allowed to drive home.   Name and phone number of your driver:    Special instructions:   Special Instructions:  - Preparing for Surgery  Before surgery, you can play an important role.  Because skin is not sterile, your skin needs to be as free of germs as possible.  You can reduce the number of germs on you skin by washing with CHG (chlorahexidine gluconate) soap before surgery.  CHG is an antiseptic cleaner which kills germs and bonds with the skin to continue killing germs even after washing.  Please DO NOT use if you have an allergy to CHG or antibacterial soaps.  If your skin becomes reddened/irritated stop using  the CHG and inform your nurse when you arrive at Short Stay.  Do not shave (including legs and underarms) for at least 48 hours prior to the first CHG shower.  You may shave your face.  Please follow these instructions carefully:   1.  Shower with CHG Soap the night before surgery and the morning of Surgery.  2.  If you choose to wash your hair, wash your hair first as usual with your normal shampoo.  3.  After you shampoo, rinse your hair and body thoroughly to remove the Shampoo.  4.  Use CHG as you would any other liquid soap.  You can apply chg directly  to the skin and wash gently with scrungie or a clean washcloth.  5.  Apply the CHG Soap to your body ONLY FROM THE NECK DOWN.  Do not use on open wounds or open sores.  Avoid contact with your eyes ears, mouth and genitals (private parts).  Wash genitals (private parts)       with your normal soap.  6.  Wash thoroughly, paying special attention to the area where your surgery will be performed.  7.  Thoroughly rinse your body with warm water from the neck down.  8.  DO NOT shower/wash with your normal soap after using and rinsing off the CHG Soap.  9.  Pat yourself dry with a clean towel.  10.  Wear clean pajamas.            11.  Place clean sheets on your bed the night of your first shower and do not sleep with pets.  Day of Surgery  Do not apply any lotions/deodorants the morning of surgery.  Please wear clean clothes to the hospital/surgery center.  Please read over the following fact sheets that you were given. Pain Booklet, Coughing and Deep Breathing, Blood Transfusion Information, MRSA Information and Surgical Site Infection Prevention

## 2015-03-31 ENCOUNTER — Encounter (HOSPITAL_COMMUNITY): Payer: Self-pay | Admitting: Anesthesiology

## 2015-03-31 MED ORDER — CEFAZOLIN SODIUM-DEXTROSE 2-3 GM-% IV SOLR
2.0000 g | INTRAVENOUS | Status: AC
  Administered 2015-04-01 (×2): 2 g via INTRAVENOUS
  Filled 2015-03-31: qty 50

## 2015-04-01 ENCOUNTER — Encounter (HOSPITAL_COMMUNITY): Payer: Self-pay | Admitting: Anesthesiology

## 2015-04-01 ENCOUNTER — Inpatient Hospital Stay (HOSPITAL_COMMUNITY): Payer: PPO

## 2015-04-01 ENCOUNTER — Encounter (HOSPITAL_COMMUNITY)
Admission: RE | Disposition: A | Discharge status: Home / Self Care | Payer: Self-pay | Source: Ambulatory Visit | Attending: Neurosurgery

## 2015-04-01 ENCOUNTER — Inpatient Hospital Stay (HOSPITAL_COMMUNITY)
Admission: RE | Admit: 2015-04-01 | Discharge: 2015-04-04 | DRG: 460 | Disposition: A | Discharge status: Home / Self Care | Payer: PPO | Source: Ambulatory Visit | Attending: Neurosurgery | Admitting: Neurosurgery

## 2015-04-01 ENCOUNTER — Inpatient Hospital Stay (HOSPITAL_COMMUNITY): Payer: PPO | Admitting: Anesthesiology

## 2015-04-01 DIAGNOSIS — M4806 Spinal stenosis, lumbar region: Principal | ICD-10-CM | POA: Diagnosis present

## 2015-04-01 DIAGNOSIS — Z79899 Other long term (current) drug therapy: Secondary | ICD-10-CM

## 2015-04-01 DIAGNOSIS — Z79891 Long term (current) use of opiate analgesic: Secondary | ICD-10-CM | POA: Diagnosis not present

## 2015-04-01 DIAGNOSIS — Z96611 Presence of right artificial shoulder joint: Secondary | ICD-10-CM | POA: Diagnosis present

## 2015-04-01 DIAGNOSIS — M5116 Intervertebral disc disorders with radiculopathy, lumbar region: Secondary | ICD-10-CM | POA: Diagnosis present

## 2015-04-01 DIAGNOSIS — Z96653 Presence of artificial knee joint, bilateral: Secondary | ICD-10-CM | POA: Diagnosis present

## 2015-04-01 DIAGNOSIS — K219 Gastro-esophageal reflux disease without esophagitis: Secondary | ICD-10-CM | POA: Diagnosis present

## 2015-04-01 DIAGNOSIS — Z87891 Personal history of nicotine dependence: Secondary | ICD-10-CM

## 2015-04-01 DIAGNOSIS — E039 Hypothyroidism, unspecified: Secondary | ICD-10-CM | POA: Diagnosis present

## 2015-04-01 DIAGNOSIS — E785 Hyperlipidemia, unspecified: Secondary | ICD-10-CM | POA: Diagnosis present

## 2015-04-01 DIAGNOSIS — M549 Dorsalgia, unspecified: Secondary | ICD-10-CM | POA: Diagnosis present

## 2015-04-01 DIAGNOSIS — M4317 Spondylolisthesis, lumbosacral region: Secondary | ICD-10-CM | POA: Diagnosis present

## 2015-04-01 DIAGNOSIS — Z419 Encounter for procedure for purposes other than remedying health state, unspecified: Secondary | ICD-10-CM

## 2015-04-01 DIAGNOSIS — M4316 Spondylolisthesis, lumbar region: Secondary | ICD-10-CM | POA: Diagnosis present

## 2015-04-01 SURGERY — POSTERIOR LUMBAR FUSION 2 LEVEL
Anesthesia: General

## 2015-04-01 MED ORDER — LACTATED RINGERS IV SOLN
INTRAVENOUS | Status: DC | PRN
  Administered 2015-04-01 (×3): via INTRAVENOUS

## 2015-04-01 MED ORDER — SODIUM CHLORIDE 0.9 % IV SOLN
INTRAVENOUS | Status: DC | PRN
  Administered 2015-04-01: 14:00:00 via INTRAVENOUS

## 2015-04-01 MED ORDER — SURGIFOAM 100 EX MISC
CUTANEOUS | Status: DC | PRN
  Administered 2015-04-01: 10:00:00 via TOPICAL
  Administered 2015-04-01: 20 mL via TOPICAL

## 2015-04-01 MED ORDER — NEOSTIGMINE METHYLSULFATE 10 MG/10ML IV SOLN
INTRAVENOUS | Status: DC | PRN
  Administered 2015-04-01: 3 mg via INTRAVENOUS

## 2015-04-01 MED ORDER — LEVOTHYROXINE SODIUM 88 MCG PO TABS
88.0000 ug | ORAL_TABLET | Freq: Every day | ORAL | Status: DC
  Administered 2015-04-02 – 2015-04-04 (×3): 88 ug via ORAL
  Filled 2015-04-01 (×3): qty 1

## 2015-04-01 MED ORDER — PROPOFOL 10 MG/ML IV BOLUS
INTRAVENOUS | Status: AC
Start: 2015-04-01 — End: 2015-04-01
  Filled 2015-04-01: qty 20

## 2015-04-01 MED ORDER — DIAZEPAM 5 MG PO TABS
5.0000 mg | ORAL_TABLET | Freq: Four times a day (QID) | ORAL | Status: DC | PRN
  Administered 2015-04-02 – 2015-04-03 (×3): 5 mg via ORAL
  Filled 2015-04-01 (×3): qty 1

## 2015-04-01 MED ORDER — EPHEDRINE SULFATE 50 MG/ML IJ SOLN
INTRAMUSCULAR | Status: DC | PRN
  Administered 2015-04-01: 10 mg via INTRAVENOUS

## 2015-04-01 MED ORDER — LIDOCAINE HCL (CARDIAC) 20 MG/ML IV SOLN
INTRAVENOUS | Status: DC | PRN
  Administered 2015-04-01: 50 mg via INTRAVENOUS

## 2015-04-01 MED ORDER — ONDANSETRON HCL 4 MG/2ML IJ SOLN: 4.0000 mg | Freq: Once | INTRAMUSCULAR | Status: DC | PRN

## 2015-04-01 MED ORDER — ACETAMINOPHEN 650 MG RE SUPP: 650.0000 mg | RECTAL | Status: DC | PRN

## 2015-04-01 MED ORDER — ACETAMINOPHEN 325 MG PO TABS
650.0000 mg | ORAL_TABLET | ORAL | Status: DC | PRN
  Administered 2015-04-03 – 2015-04-04 (×3): 650 mg via ORAL
  Filled 2015-04-01 (×3): qty 2

## 2015-04-01 MED ORDER — HYDROMORPHONE HCL 1 MG/ML IJ SOLN
INTRAMUSCULAR | Status: AC
  Filled 2015-04-01: qty 1

## 2015-04-01 MED ORDER — SIMVASTATIN 20 MG PO TABS
20.0000 mg | ORAL_TABLET | Freq: Every evening | ORAL | Status: DC
  Administered 2015-04-01 – 2015-04-03 (×3): 20 mg via ORAL
  Filled 2015-04-01 (×3): qty 1

## 2015-04-01 MED ORDER — LACTATED RINGERS IV SOLN
INTRAVENOUS | Status: DC
  Administered 2015-04-01: 08:00:00 via INTRAVENOUS

## 2015-04-01 MED ORDER — SODIUM CHLORIDE 0.9 % IR SOLN
Status: DC | PRN
  Administered 2015-04-01: 500 mL
  Administered 2015-04-01: 10:00:00

## 2015-04-01 MED ORDER — HYDROMORPHONE HCL 1 MG/ML IJ SOLN
0.5000 mg | INTRAMUSCULAR | Status: DC | PRN
  Administered 2015-04-01: 0.5 mg via INTRAVENOUS

## 2015-04-01 MED ORDER — PROPOFOL 10 MG/ML IV BOLUS
INTRAVENOUS | Status: DC | PRN
  Administered 2015-04-01: 150 mg via INTRAVENOUS

## 2015-04-01 MED ORDER — VANCOMYCIN HCL 1000 MG IV SOLR
INTRAVENOUS | Status: AC
  Filled 2015-04-01: qty 1000

## 2015-04-01 MED ORDER — ALBUMIN HUMAN 5 % IV SOLN
INTRAVENOUS | Status: DC | PRN
Start: 2015-04-01 — End: 2015-04-01
  Administered 2015-04-01 (×2): via INTRAVENOUS

## 2015-04-01 MED ORDER — MORPHINE SULFATE 2 MG/ML IJ SOLN: 1.0000 mg | INTRAMUSCULAR | Status: DC | PRN

## 2015-04-01 MED ORDER — DEXTROSE 5 % IV SOLN
10.0000 mg | INTRAVENOUS | Status: DC | PRN
  Administered 2015-04-01: 25 ug/min via INTRAVENOUS

## 2015-04-01 MED ORDER — OXYCODONE-ACETAMINOPHEN 5-325 MG PO TABS
1.0000 | ORAL_TABLET | ORAL | Status: DC | PRN
  Administered 2015-04-01: 1 via ORAL
  Administered 2015-04-02: 2 via ORAL
  Administered 2015-04-02 (×2): 1 via ORAL
  Administered 2015-04-02: 2 via ORAL
  Administered 2015-04-04 (×2): 1 via ORAL
  Filled 2015-04-01 (×3): qty 1
  Filled 2015-04-01 (×2): qty 2
  Filled 2015-04-01: qty 1
  Filled 2015-04-01: qty 2

## 2015-04-01 MED ORDER — 0.9 % SODIUM CHLORIDE (POUR BTL) OPTIME
TOPICAL | Status: DC | PRN
  Administered 2015-04-01: 1000 mL

## 2015-04-01 MED ORDER — ALUM & MAG HYDROXIDE-SIMETH 200-200-20 MG/5ML PO SUSP
30.0000 mL | Freq: Four times a day (QID) | ORAL | Status: DC | PRN
Start: 2015-04-01 — End: 2015-04-04

## 2015-04-01 MED ORDER — FENTANYL CITRATE (PF) 100 MCG/2ML IJ SOLN
INTRAMUSCULAR | Status: DC | PRN
  Administered 2015-04-01 (×2): 50 ug via INTRAVENOUS
  Administered 2015-04-01: 100 ug via INTRAVENOUS

## 2015-04-01 MED ORDER — PANTOPRAZOLE SODIUM 40 MG PO TBEC
40.0000 mg | DELAYED_RELEASE_TABLET | Freq: Every day | ORAL | Status: DC
  Administered 2015-04-02 – 2015-04-04 (×3): 40 mg via ORAL
  Filled 2015-04-01 (×3): qty 1

## 2015-04-01 MED ORDER — BUPIVACAINE LIPOSOME 1.3 % IJ SUSP
20.0000 mL | INTRAMUSCULAR | Status: DC
  Filled 2015-04-01: qty 20

## 2015-04-01 MED ORDER — PHENOL 1.4 % MT LIQD: 1.0000 | OROMUCOSAL | Status: DC | PRN

## 2015-04-01 MED ORDER — LACTATED RINGERS IV SOLN
INTRAVENOUS | Status: DC
Start: 2015-04-01 — End: 2015-04-04
  Administered 2015-04-01: 19:00:00 via INTRAVENOUS

## 2015-04-01 MED ORDER — BUPIVACAINE-EPINEPHRINE (PF) 0.5% -1:200000 IJ SOLN
INTRAMUSCULAR | Status: DC | PRN
  Administered 2015-04-01: 10 mL via PERINEURAL

## 2015-04-01 MED ORDER — MENTHOL 3 MG MT LOZG
1.0000 | LOZENGE | OROMUCOSAL | Status: DC | PRN
Start: 2015-04-01 — End: 2015-04-04

## 2015-04-01 MED ORDER — PHENYLEPHRINE HCL 10 MG/ML IJ SOLN
INTRAMUSCULAR | Status: DC | PRN
  Administered 2015-04-01 (×2): 80 ug via INTRAVENOUS

## 2015-04-01 MED ORDER — FENTANYL CITRATE (PF) 250 MCG/5ML IJ SOLN
INTRAMUSCULAR | Status: AC
  Filled 2015-04-01: qty 5

## 2015-04-01 MED ORDER — CEFAZOLIN SODIUM-DEXTROSE 2-3 GM-% IV SOLR
2.0000 g | Freq: Three times a day (TID) | INTRAVENOUS | Status: AC
  Administered 2015-04-01 – 2015-04-02 (×2): 2 g via INTRAVENOUS
  Filled 2015-04-01 (×2): qty 50

## 2015-04-01 MED ORDER — ROCURONIUM BROMIDE 100 MG/10ML IV SOLN
INTRAVENOUS | Status: DC | PRN
  Administered 2015-04-01: 40 mg via INTRAVENOUS

## 2015-04-01 MED ORDER — GLYCOPYRROLATE 0.2 MG/ML IJ SOLN
INTRAMUSCULAR | Status: DC | PRN
  Administered 2015-04-01: 0.6 mg via INTRAVENOUS

## 2015-04-01 MED ORDER — CYCLOSPORINE 0.05 % OP EMUL
1.0000 [drp] | Freq: Two times a day (BID) | OPHTHALMIC | Status: DC
  Administered 2015-04-01 – 2015-04-04 (×6): 1 [drp] via OPHTHALMIC
  Filled 2015-04-01 (×7): qty 1

## 2015-04-01 MED ORDER — ONDANSETRON HCL 4 MG/2ML IJ SOLN
4.0000 mg | INTRAMUSCULAR | Status: DC | PRN
  Administered 2015-04-02: 4 mg via INTRAVENOUS
  Filled 2015-04-01: qty 2

## 2015-04-01 MED ORDER — ONDANSETRON HCL 4 MG/2ML IJ SOLN
INTRAMUSCULAR | Status: DC | PRN
  Administered 2015-04-01: 4 mg via INTRAVENOUS

## 2015-04-01 MED ORDER — HYDROCODONE-ACETAMINOPHEN 5-325 MG PO TABS
1.0000 | ORAL_TABLET | ORAL | Status: DC | PRN
  Administered 2015-04-03: 1 via ORAL
  Filled 2015-04-01: qty 2

## 2015-04-01 MED ORDER — BISACODYL 10 MG RE SUPP: 10.0000 mg | Freq: Every day | RECTAL | Status: DC | PRN

## 2015-04-01 MED ORDER — DOCUSATE SODIUM 100 MG PO CAPS
100.0000 mg | ORAL_CAPSULE | Freq: Two times a day (BID) | ORAL | Status: DC
  Administered 2015-04-01 – 2015-04-04 (×6): 100 mg via ORAL
  Filled 2015-04-01 (×6): qty 1

## 2015-04-01 SURGICAL SUPPLY — 71 items
BAG DECANTER FOR FLEXI CONT (MISCELLANEOUS) ×3 IMPLANT
BENZOIN TINCTURE PRP APPL 2/3 (GAUZE/BANDAGES/DRESSINGS) ×3 IMPLANT
BLADE CLIPPER SURG (BLADE) IMPLANT
BRUSH SCRUB EZ PLAIN DRY (MISCELLANEOUS) ×3 IMPLANT
BUR MATCHSTICK NEURO 3.0 LAGG (BURR) ×3 IMPLANT
BUR PRECISION FLUTE 6.0 (BURR) ×3 IMPLANT
CANISTER SUCT 3000ML PPV (MISCELLANEOUS) ×3 IMPLANT
CAP REVERE LOCKING (Cap) ×18 IMPLANT
CLOSURE WOUND 1/2 X4 (GAUZE/BANDAGES/DRESSINGS) ×1
CONN CROSSLINK REV 6.35 48-60 (Connector) ×3 IMPLANT
CONNECTOR CRSLNK REV6.35 48-60 (Connector) ×1 IMPLANT
CONT SPEC 4OZ CLIKSEAL STRL BL (MISCELLANEOUS) ×6 IMPLANT
COVER BACK TABLE 60X90IN (DRAPES) ×3 IMPLANT
DRAPE C-ARM 42X72 X-RAY (DRAPES) ×9 IMPLANT
DRAPE LAPAROTOMY 100X72X124 (DRAPES) ×3 IMPLANT
DRAPE POUCH INSTRU U-SHP 10X18 (DRAPES) ×3 IMPLANT
DRAPE PROXIMA HALF (DRAPES) ×6 IMPLANT
DRAPE SURG 17X23 STRL (DRAPES) ×12 IMPLANT
ELECT BLADE 4.0 EZ CLEAN MEGAD (MISCELLANEOUS) ×3
ELECT REM PT RETURN 9FT ADLT (ELECTROSURGICAL) ×3
ELECTRODE BLDE 4.0 EZ CLN MEGD (MISCELLANEOUS) ×1 IMPLANT
ELECTRODE REM PT RTRN 9FT ADLT (ELECTROSURGICAL) ×1 IMPLANT
EVACUATOR 1/8 PVC DRAIN (DRAIN) ×3 IMPLANT
GAUZE SPONGE 4X4 12PLY STRL (GAUZE/BANDAGES/DRESSINGS) ×3 IMPLANT
GAUZE SPONGE 4X4 16PLY XRAY LF (GAUZE/BANDAGES/DRESSINGS) ×3 IMPLANT
GLOVE BIO SURGEON STRL SZ 6.5 (GLOVE) ×2 IMPLANT
GLOVE BIO SURGEON STRL SZ8 (GLOVE) ×6 IMPLANT
GLOVE BIO SURGEON STRL SZ8.5 (GLOVE) ×6 IMPLANT
GLOVE BIO SURGEONS STRL SZ 6.5 (GLOVE) ×1
GLOVE ECLIPSE 7.5 STRL STRAW (GLOVE) ×12 IMPLANT
GLOVE EXAM NITRILE LRG STRL (GLOVE) IMPLANT
GLOVE EXAM NITRILE MD LF STRL (GLOVE) IMPLANT
GLOVE EXAM NITRILE XL STR (GLOVE) IMPLANT
GLOVE EXAM NITRILE XS STR PU (GLOVE) IMPLANT
GLOVE INDICATOR 6.5 STRL GRN (GLOVE) ×3 IMPLANT
GLOVE INDICATOR 8.0 STRL GRN (GLOVE) ×6 IMPLANT
GOWN STRL REUS W/ TWL LRG LVL3 (GOWN DISPOSABLE) ×2 IMPLANT
GOWN STRL REUS W/ TWL XL LVL3 (GOWN DISPOSABLE) ×2 IMPLANT
GOWN STRL REUS W/TWL 2XL LVL3 (GOWN DISPOSABLE) ×6 IMPLANT
GOWN STRL REUS W/TWL LRG LVL3 (GOWN DISPOSABLE) ×4
GOWN STRL REUS W/TWL XL LVL3 (GOWN DISPOSABLE) ×4
KIT BASIN OR (CUSTOM PROCEDURE TRAY) ×3 IMPLANT
KIT ROOM TURNOVER OR (KITS) ×3 IMPLANT
MILL MEDIUM DISP (BLADE) ×3 IMPLANT
NEEDLE HYPO 21X1.5 SAFETY (NEEDLE) ×3 IMPLANT
NEEDLE HYPO 22GX1.5 SAFETY (NEEDLE) ×3 IMPLANT
NS IRRIG 1000ML POUR BTL (IV SOLUTION) ×3 IMPLANT
PACK LAMINECTOMY NEURO (CUSTOM PROCEDURE TRAY) ×3 IMPLANT
PAD ARMBOARD 7.5X6 YLW CONV (MISCELLANEOUS) ×9 IMPLANT
PATTIES SURGICAL .5 X.5 (GAUZE/BANDAGES/DRESSINGS) ×3 IMPLANT
PATTIES SURGICAL .5 X1 (DISPOSABLE) IMPLANT
PATTIES SURGICAL 1X1 (DISPOSABLE) ×3 IMPLANT
ROD REVERE 7.5MM (Rod) ×6 IMPLANT
SCREW REVERE 6.35 6.5X40MM (Screw) ×6 IMPLANT
SCREW REVERE 6.35 6.5X55MM (Screw) ×6 IMPLANT
SCREW REVERE 6.5X50MM (Screw) ×6 IMPLANT
SPACER ALTERA 10X31 9-13MM-15 (Spacer) ×6 IMPLANT
SPONGE LAP 4X18 X RAY DECT (DISPOSABLE) ×6 IMPLANT
SPONGE NEURO XRAY DETECT 1X3 (DISPOSABLE) IMPLANT
SPONGE SURGIFOAM ABS GEL 100 (HEMOSTASIS) ×3 IMPLANT
STRIP BIOACTIVE 20CC 25X100X8 (Miscellaneous) ×3 IMPLANT
STRIP CLOSURE SKIN 1/2X4 (GAUZE/BANDAGES/DRESSINGS) ×2 IMPLANT
SUT VIC AB 1 CT1 18XBRD ANBCTR (SUTURE) ×2 IMPLANT
SUT VIC AB 1 CT1 8-18 (SUTURE) ×4
SUT VIC AB 2-0 CP2 18 (SUTURE) ×6 IMPLANT
SYR 20CC LL (SYRINGE) ×3 IMPLANT
SYR 20ML ECCENTRIC (SYRINGE) ×3 IMPLANT
TOWEL OR 17X24 6PK STRL BLUE (TOWEL DISPOSABLE) ×3 IMPLANT
TOWEL OR 17X26 10 PK STRL BLUE (TOWEL DISPOSABLE) ×3 IMPLANT
TRAY FOLEY W/METER SILVER 14FR (SET/KITS/TRAYS/PACK) ×3 IMPLANT
WATER STERILE IRR 1000ML POUR (IV SOLUTION) ×3 IMPLANT

## 2015-04-01 NOTE — Op Note (Signed)
Brief history: The patient is a 74 year old white female who has complained of back and leg pain consistent with neurogenic claudication. She has failed medical management and was worked up with a lumbar MRI. This demonstrated the patient had a spondylolisthesis and stenosis at L4-5 and L5-S1. I discussed the situation with the patient. We have discussed the various treatment options including surgery. She has weighed the risks, benefits, and alternatives surgery and decided proceed with an L4-5 and L5-S1 decompression, instrumentation, and fusion.  Preoperative diagnosis: L4-5 and L5-S1 spondylolisthesis, Degenerative disc disease, spinal stenosis compressing L4, L5 and S1 nerve roots; lumbago; lumbar radiculopathy  Postoperative diagnosis: The same  Procedure: L5 laminectomy with bilateral L4 laminotomies to decompress the bilateral L4, L5 and S1 nerve roots(the work required to do this was in addition to the work required to do the posterior lumbar interbody fusion because of the patient's spinal stenosis, facet arthropathy. Etc. requiring a wide decompression of the nerve roots.); L4-5 and L5-S1 transforaminal lumbar interbody fusion with local morselized autograft bone and Kinnex graft extender; insertion of interbody prosthesis at L4-5 and L5-S1 (globus peek expandable interbody prosthesis); posterior segmental instrumentation from L4 to S1 with globus titanium pedicle screws and rods; posterior lateral arthrodesis at L4-5 and L5-S1 with local morselized autograft bone and Kinnex bone graft extender.  Surgeon: Dr. Earle Gell  Asst.: None  Anesthesia: Gen. endotracheal  Estimated blood loss: 400 mL  Drains: One medium Hemovac  Complications: None  Description of procedure: The patient was brought to the operating room by the anesthesia team. General endotracheal anesthesia was induced. The patient was turned to the prone position on the Wilson frame. The patient's lumbosacral region was  then prepared with Betadine scrub and Betadine solution. Sterile drapes were applied.  I then injected the area to be incised with Marcaine with epinephrine solution. I then used the scalpel to make a linear midline incision over the L4-5 and L5-S1 interspace. I then used electrocautery to perform a bilateral subperiosteal dissection exposing the spinous process and lamina of 4, L5 and S1. We then obtained intraoperative radiograph to confirm our location. We then inserted the Verstrac retractor to provide exposure.  I began the decompression by using the Leksell wedge or to remove the spinous process of L5. I used the the high speed drill to perform laminotomies at L4 and L5 bilaterally. We then used the Kerrison punches to widen the laminotomy and removed the ligamentum flavum at L4-5 and L5-S1. We used the Kerrison punches to remove the medial facets at L4-5 and L5-S1 bilaterally. We performed wide foraminotomies about the bilateral L4, L5 and S1 nerve roots completing the decompression.  We now turned our attention to the posterior lumbar interbody fusion. I used a scalpel to incise the intervertebral disc at L4-5 and L5-S1 bilaterally. I then performed a partial intervertebral discectomy at L4-5 and L5-S1 bilaterally using the pituitary forceps. We prepared the vertebral endplates at R6-7 and E9-F8 bilaterally for the fusion by removing the soft tissues with the curettes. We then used the trial spacers to pick the appropriate sized interbody prosthesis. We prefilled his prosthesis with a combination of local morselized autograft bone that we obtained during the decompression as well as Kinnex bone graft extender. We inserted the prefilled prosthesis into the interspace at 45 and L5-S1. We then expanded the prosthesis. There was a good snug fit of the prosthesis in the interspace. We then filled and the remainder of the intervertebral disc space with local morselized autograft  bone and Kinnex. This  completed the posterior lumbar interbody arthrodesis.  We now turned attention to the instrumentation. Under fluoroscopic guidance we cannulated the bilateral L4, L5 and S1 pedicles with the bone probe. We then removed the bone probe. We then tapped the pedicle with a 5.5 millimeter tap. We then removed the tap. We probed inside the tapped pedicle with a ball probe to rule out cortical breaches. We then inserted a 6.5 x 55, 50 and 40 millimeter pedicle screw into the L4, L5 and S1 pedicles bilaterally under fluoroscopic guidance. We then palpated along the medial aspect of the pedicles to rule out cortical breaches. There were none. The nerve roots were not injured. We then connected the unilateral pedicle screws with a lordotic rod. We compressed the construct and secured the rod in place with the caps. We then tightened the caps appropriately. We placed a cross connector between the rods and tightened it appropriately This completed the instrumentation from L4-S1.  We now turned our attention to the posterior lateral arthrodesis at L4-5 and L5-S1. We used the high-speed drill to decorticate the remainder of the facets, pars, transverse process at L4-5 and L5-S1. We then applied a combination of local morselized autograft bone and Kinnex bone graft extender over these decorticated posterior lateral structures. This completed the posterior lateral arthrodesis.  We then obtained hemostasis using bipolar electrocautery. We irrigated the wound out with bacitracin solution. We inspected the thecal sac and nerve roots and noted they were well decompressed. We then removed the retractor. We placed a medium Hemovac drain in the epidural space and tunneled out through separate stab wound. I placed vancomycin powder in the wound We reapproximated patient's thoracolumbar fascia with interrupted #1 Vicryl suture. We reapproximated patient's subcutaneous tissue with interrupted 2-0 Vicryl suture. The reapproximated  patient's skin with Steri-Strips and benzoin. The wound was then coated with bacitracin ointment. A sterile dressing was applied. The drapes were removed. The patient was subsequently returned to the supine position where they were extubated by the anesthesia team. He was then transported to the post anesthesia care unit in stable condition. All sponge instrument and needle counts were reportedly correct at the end of this case.

## 2015-04-01 NOTE — H&P (Signed)
Subjective: Patient is a 74 year old white female who has complained of back, buttock, and leg pain consistent with neurogenic claudication. She has failed medical management and was worked up with a lumbar MRI which demonstrated spinal stenosis, spinal listhesis, etc. at L4-5 and L5-S1. I discussed the situation with the patient. We discussed the various treatment options clinic surgery. She has weighed the risks, benefits, and alternative surgery and decided proceed with an L4-5 and L5-S1 decompression, instrumentation, and fusion.   Past Medical History  Diagnosis Date  . Hypothyroidism   . GERD (gastroesophageal reflux disease)   . Hyperlipemia   . Arthritis     "probably all over; knees, shoulders, hands" (06/17/2012)  . OSA on CPAP     cpap 80yrs    Past Surgical History  Procedure Laterality Date  . Carpal tunnel release  ~ 1997    Left  . Reverse total shoulder arthroplasty  06/17/2012    right  . Total knee arthroplasty  02/2012    right  . Cholecystectomy  ~ 1996    Lap  . Dilation and curettage of uterus  1980's?  . Tubal ligation  1973  . Reverse shoulder arthroplasty  06/17/2012    Procedure: REVERSE SHOULDER ARTHROPLASTY;  Surgeon: Nita Sells, MD;  Location: [place];  Service: Orthopedics;  Laterality: Right;  . Knee arthroplasty Left 13  . Joint replacement  13,15    Right Knee, left    No Known Allergies  History  Substance Use Topics  . Smoking status: Former Smoker -- 1.50 packs/day for 25 years    Types: Cigarettes    Quit date: 09/18/1980  . Smokeless tobacco: Never Used  . Alcohol Use: No    History reviewed. No pertinent family history. Prior to Admission medications   Medication Sig Start Date End Date Taking? Authorizing Provider  BIOTIN PO Take 1 tablet by mouth daily.   Yes Historical Provider, MD  CALCIUM PO Take 2 tablets by mouth 2 (two) times daily.   Yes Historical Provider, MD  Cholecalciferol (VITAMIN D PO) Take 1 tablet by mouth 2  (two) times daily.   Yes Historical Provider, MD  cycloSPORINE (RESTASIS) 0.05 % ophthalmic emulsion Place 1 drop into both eyes 2 (two) times daily.   Yes Historical Provider, MD  HYDROcodone-acetaminophen (NORCO/VICODIN) 5-325 MG per tablet Take 1-2 tablets by mouth every 4 (four) hours as needed. Patient taking differently: Take 1-2 tablets by mouth every 4 (four) hours as needed (pain).  06/18/12  Yes Danielle Laliberte, PA-C  levothyroxine (SYNTHROID, LEVOTHROID) 88 MCG tablet Take 88 mcg by mouth daily.   Yes Historical Provider, MD  meloxicam (MOBIC) 7.5 MG tablet Take 7.5 mg by mouth 2 (two) times daily.   Yes Historical Provider, MD  Multiple Vitamin (MULTIVITAMIN WITH MINERALS) TABS Take 1 tablet by mouth daily.   Yes Historical Provider, MD  Multiple Vitamins-Minerals (PRESERVISION AREDS 2 PO) Take 1 capsule by mouth 2 (two) times daily.   Yes Historical Provider, MD  omeprazole (PRILOSEC) 20 MG capsule Take 20 mg by mouth daily.   Yes Historical Provider, MD  simvastatin (ZOCOR) 20 MG tablet Take 20 mg by mouth every evening.   Yes Historical Provider, MD     Review of Systems  Positive ROS: As above  All other systems have been reviewed and were otherwise negative with the exception of those mentioned in the HPI and as above.  Objective: Vital signs in last 24 hours: Temp:  [97.8 F (36.6 C)]  97.8 F (36.6 C) (07/14 0720) Pulse Rate:  [73] 73 (07/14 0720) Resp:  [18] 18 (07/14 0720) BP: (165)/(75) 165/75 mmHg (07/14 0720) SpO2:  [98 %] 98 % (07/14 0720) Weight:  [80.372 kg (177 lb 3 oz)] 80.372 kg (177 lb 3 oz) (07/14 0720)  General Appearance: Alert, cooperative, no distress, Head: Normocephalic, without obvious abnormality, atraumatic Eyes: PERRL, conjunctiva/corneas clear, EOM's intact,    Ears: Normal  Throat: Normal  Neck: Supple, symmetrical, trachea midline, no adenopathy; thyroid: No enlargement/tenderness/nodules; no carotid bruit or JVD Back: Symmetric, no  curvature, ROM normal, no CVA tenderness Lungs: Clear to auscultation bilaterally, respirations unlabored Heart: Regular rate and rhythm, no murmur, rub or gallop Abdomen: Soft, non-tender,, no masses, no organomegaly Extremities: Extremities normal, atraumatic, no cyanosis or edema Pulses: 2+ and symmetric all extremities Skin: Skin color, texture, turgor normal, no rashes or lesions  NEUROLOGIC:   Mental status: alert and oriented, no aphasia, good attention span, Fund of knowledge/ memory ok Motor Exam - grossly normal Sensory Exam - grossly normal Reflexes:  Coordination - grossly normal Gait - grossly normal Balance - grossly normal Cranial Nerves: I: smell Not tested  II: visual acuity  OS: Normal  OD: Normal   II: visual fields Full to confrontation  II: pupils Equal, round, reactive to light  III,VII: ptosis None  III,IV,VI: extraocular muscles  Full ROM  V: mastication Normal  V: facial light touch sensation  Normal  V,VII: corneal reflex  Present  VII: facial muscle function - upper  Normal  VII: facial muscle function - lower Normal  VIII: hearing Not tested  IX: soft palate elevation  Normal  IX,X: gag reflex Present  XI: trapezius strength  5/5  XI: sternocleidomastoid strength 5/5  XI: neck flexion strength  5/5  XII: tongue strength  Normal    Data Review Lab Results  Component Value Date   WBC 4.9 03/24/2015   HGB 14.4 03/24/2015   HCT 43.2 03/24/2015   MCV 94.9 03/24/2015   PLT 146* 03/24/2015   Lab Results  Component Value Date   NA 141 03/24/2015   K 4.4 03/24/2015   CL 106 03/24/2015   CO2 27 03/24/2015   BUN 12 03/24/2015   CREATININE 0.58 03/24/2015   GLUCOSE 89 03/24/2015   Lab Results  Component Value Date   INR 0.9 09/01/2014    Assessment/Plan: L4-5 and L5-S1 spondylolisthesis, spinal stenosis, lumbago, lumbar radiculopathy, neurogenic claudication: I discussed the situation with the patient. I have reviewed her imaging studies  with her and pointed out the abnormalities. We have discussed the various treatment options including surgery. I have described the surgical treatment option of an L4-5 and L5-S1 decompression, instrumentation, and fusion. I have shown her surgical models. We have discussed the risks, benefits, alternatives, and likelihood of achieving our goals with surgery. I have answered all the patient's questions. She has decided to proceed with surgery.   Lianne Carreto D 04/01/2015 9:56 AM

## 2015-04-01 NOTE — Anesthesia Postprocedure Evaluation (Signed)
  Anesthesia Post-op Note  Patient: Joanne Nash  Procedure(s) Performed: Procedure(s): LUMBAR FOUR-FIVE,LUMBAR FIVE SACRAL ONE DECOMPRESSION LAMINECTOMIES WITH POSTERIOR LUMBAR INTERBODY WITH INTERBODY PROTHESIS POSTERIOR LATERAL ARTHRODESIS AND POSTERIOR NONSEGMENTAL INSTRUMENTATION   (N/A)  Patient Location: PACU  Anesthesia Type:General  Level of Consciousness: awake, alert , oriented and patient cooperative  Airway and Oxygen Therapy: Patient Spontanous Breathing  Post-op Pain: mild, moderate  Post-op Assessment: Post-op Vital signs reviewed, Patient's Cardiovascular Status Stable, Respiratory Function Stable, Patent Airway, No signs of Nausea or vomiting and Pain level controlled              Post-op Vital Signs: stable  Last Vitals:  Filed Vitals:   04/01/15 1645  BP: 135/51  Pulse: 85  Temp:   Resp: 24    Complications: No apparent anesthesia complications

## 2015-04-01 NOTE — Progress Notes (Signed)
Subjective:  The patient is alert and pleasant. She is in no apparent distress. She looks well.  Objective: Vital signs in last 24 hours: Temp:  [97.8 F (36.6 C)-98.4 F (36.9 C)] 98.4 F (36.9 C) (07/14 1540) Pulse Rate:  [73] 73 (07/14 0720) Resp:  [18] 18 (07/14 0720) BP: (140-165)/(60-75) 140/60 mmHg (07/14 1540) SpO2:  [98 %] 98 % (07/14 0720) Weight:  [80.372 kg (177 lb 3 oz)] 80.372 kg (177 lb 3 oz) (07/14 0720)  Intake/Output from previous day:   Intake/Output this shift: Total I/O In: 3500 [I.V.:2900; Blood:100; IV Piggyback:500] Out: 970 [Urine:520; Blood:450]  Physical exam the patient is alert and pleasant. She is moving her lower extremities well.  Lab Results: No results for input(s): WBC, HGB, HCT, PLT in the last 72 hours. BMET No results for input(s): NA, K, CL, CO2, GLUCOSE, BUN, CREATININE, CALCIUM in the last 72 hours.  Studies/Results: Dg Lumbar Spine 2-3 Views  04/01/2015   CLINICAL DATA:  Posterior fixation screw placements with posterior lateral arthrodesis  EXAM: LUMBAR SPINE - 2-3 VIEW; DG C-ARM 61-120 MIN  COMPARISON:  Study obtained earlier in the day and February 23, 2015  FLUOROSCOPY TIME:  0 minutes 39 second ; two views acquired  FINDINGS: Frontal and lateral views obtained. Pedicle screws are noted at L4, L5, S1 traversing the respective pedicles with the tips of the respective screws in the respective vertebral bodies. There are disc spacers at L4-5 and L5-S1. There is persistent anterolisthesis of L4 on L5.  IMPRESSION: Pedicle screw placement bilaterally at L4, L5, and S1 as described. Stable anterolisthesis L4 on L5.   Electronically Signed   By: Lowella Grip III M.D.   On: 04/01/2015 15:04   Dg Lumbar Spine 1 View  04/01/2015   CLINICAL DATA:  Lumbar spine surgery.  EXAM: LUMBAR SPINE - 1 VIEW  COMPARISON:  02/23/2015 .  FINDINGS: Metallic markers noted posteriorly at the L5 level. Degenerative changes with anterolisthesis of L4 on L5 noted.   IMPRESSION: Metallic marker noted posteriorly at the L5 level.   Electronically Signed   By: Marcello Moores  Register   On: 04/01/2015 12:48   Dg C-arm 61-120 Min  04/01/2015   CLINICAL DATA:  Posterior fixation screw placements with posterior lateral arthrodesis  EXAM: LUMBAR SPINE - 2-3 VIEW; DG C-ARM 61-120 MIN  COMPARISON:  Study obtained earlier in the day and February 23, 2015  FLUOROSCOPY TIME:  0 minutes 39 second ; two views acquired  FINDINGS: Frontal and lateral views obtained. Pedicle screws are noted at L4, L5, S1 traversing the respective pedicles with the tips of the respective screws in the respective vertebral bodies. There are disc spacers at L4-5 and L5-S1. There is persistent anterolisthesis of L4 on L5.  IMPRESSION: Pedicle screw placement bilaterally at L4, L5, and S1 as described. Stable anterolisthesis L4 on L5.   Electronically Signed   By: Lowella Grip III M.D.   On: 04/01/2015 15:04    Assessment/Plan: The patient is doing well. I spoke with her family.  LOS: 0 days     Yarel Kilcrease D 04/01/2015, 3:57 PM

## 2015-04-01 NOTE — Transfer of Care (Signed)
Immediate Anesthesia Transfer of Care Note  Patient: Joanne Nash  Procedure(s) Performed: Procedure(s): LUMBAR FOUR-FIVE,LUMBAR FIVE SACRAL ONE DECOMPRESSION LAMINECTOMIES WITH POSTERIOR LUMBAR INTERBODY WITH INTERBODY PROTHESIS POSTERIOR LATERAL ARTHRODESIS AND POSTERIOR NONSEGMENTAL INSTRUMENTATION   (N/A)  Patient Location: PACU  Anesthesia Type:General  Level of Consciousness: awake and alert   Airway & Oxygen Therapy: Patient Spontanous Breathing and Patient connected to nasal cannula oxygen  Post-op Assessment: Report given to RN, Post -op Vital signs reviewed and stable and Patient moving all extremities X 4  Post vital signs: Reviewed and stable  Last Vitals:  Filed Vitals:   04/01/15 0720  BP: 165/75  Pulse: 73  Temp: 36.6 C  Resp: 18    Complications: No apparent anesthesia complications

## 2015-04-01 NOTE — Anesthesia Preprocedure Evaluation (Addendum)
Anesthesia Evaluation  Patient identified by MRN, date of birth, ID band Patient awake    Reviewed: Allergy & Precautions, NPO status , Patient's Chart, lab work & pertinent test results  Airway Mallampati: II       Dental   Pulmonary sleep apnea , former smoker,    Pulmonary exam normal       Cardiovascular Normal cardiovascular exam    Neuro/Psych    GI/Hepatic GERD-  ,  Endo/Other  Hypothyroidism   Renal/GU      Musculoskeletal  (+) Arthritis -,   Abdominal   Peds  Hematology   Anesthesia Other Findings   Reproductive/Obstetrics                            Anesthesia Physical Anesthesia Plan  ASA: III  Anesthesia Plan: General   Post-op Pain Management:    Induction: Intravenous  Airway Management Planned: Oral ETT  Additional Equipment:   Intra-op Plan:   Post-operative Plan: Extubation in OR  Informed Consent: I have reviewed the patients History and Physical, chart, labs and discussed the procedure including the risks, benefits and alternatives for the proposed anesthesia with the patient or authorized representative who has indicated his/her understanding and acceptance.     Plan Discussed with: CRNA, Anesthesiologist and Surgeon  Anesthesia Plan Comments:         Anesthesia Quick Evaluation

## 2015-04-01 NOTE — Anesthesia Procedure Notes (Signed)
Procedure Name: Intubation Date/Time: 04/01/2015 10:08 AM Performed by: Eligha Bridegroom Pre-anesthesia Checklist: Emergency Drugs available, Timeout performed, Patient identified, Suction available and Patient being monitored Patient Re-evaluated:Patient Re-evaluated prior to inductionOxygen Delivery Method: Circle system utilized Preoxygenation: Pre-oxygenation with 100% oxygen Intubation Type: IV induction Ventilation: Mask ventilation without difficulty and Oral airway inserted - appropriate to patient size Laryngoscope Size: Mac and 3 Grade View: Grade I Tube type: Oral Tube size: 7.0 mm Number of attempts: 1 Airway Equipment and Method: Stylet and LTA kit utilized Placement Confirmation: ETT inserted through vocal cords under direct vision and breath sounds checked- equal and bilateral Secured at: 21 cm Tube secured with: Tape Dental Injury: Teeth and Oropharynx as per pre-operative assessment

## 2015-04-01 NOTE — Progress Notes (Signed)
Patient is admitted to 4N22 from pacu. Admission vital is stable

## 2015-04-02 LAB — BASIC METABOLIC PANEL
ANION GAP: 6 (ref 5–15)
BUN: 7 mg/dL (ref 6–20)
CHLORIDE: 103 mmol/L (ref 101–111)
CO2: 28 mmol/L (ref 22–32)
Calcium: 8.5 mg/dL — ABNORMAL LOW (ref 8.9–10.3)
Creatinine, Ser: 0.65 mg/dL (ref 0.44–1.00)
GFR calc Af Amer: >60 mL/min (ref >60)
GFR calc non Af Amer: >60 mL/min (ref >60)
Glucose, Bld: 133 mg/dL — ABNORMAL HIGH (ref 65–99)
POTASSIUM: 3.8 mmol/L (ref 3.5–5.1)
Sodium: 137 mmol/L (ref 135–145)

## 2015-04-02 LAB — CBC
HCT: 28.6 % — ABNORMAL LOW (ref 36.0–46.0)
Hemoglobin: 9.4 g/dL — ABNORMAL LOW (ref 12.0–15.0)
MCH: 31.5 pg (ref 26.0–34.0)
MCHC: 32.9 g/dL (ref 30.0–36.0)
MCV: 96 fL (ref 78.0–100.0)
Platelets: 110 10*3/uL — ABNORMAL LOW (ref 150–400)
RBC: 2.98 MIL/uL — ABNORMAL LOW (ref 3.87–5.11)
RDW: 12.6 % (ref 11.5–15.5)
WBC: 8.7 10*3/uL (ref 4.0–10.5)

## 2015-04-02 MED FILL — Sodium Chloride IV Soln 0.9%: INTRAVENOUS | Qty: 1000 | Status: AC

## 2015-04-02 MED FILL — Heparin Sodium (Porcine) Inj 1000 Unit/ML: INTRAMUSCULAR | Qty: 30 | Status: AC

## 2015-04-02 NOTE — Evaluation (Signed)
Physical Therapy Evaluation Patient Details Name: Joanne Nash MRN: [id number] DOB: Jul 29, 1941 Today's Date: 04/02/2015   History of Present Illness  Patient is a 74 year old white female admitted with spinal stenosis, spinal listhesis, etc. at L4-5 and L5-S1, now s/p L4-5 and L5-S1 decompression, instrumentation, and fusion.  PMH of arthritis with reverse TSA, R & L TKA (recent L 08/2014).  Clinical Impression  Patient presents with decreased independence with mobility due to deficits listed in PT problem list.  She will benefit from skilled PT in the acute setting to allow return home with HHPT and spouse assist.      Follow Up Recommendations Home health PT;Supervision/Assistance - 24 hour    Equipment Recommendations  None recommended by PT    Recommendations for Other Services       Precautions / Restrictions Precautions Precautions: Fall;Back Precaution Booklet Issued: Yes (comment) Precaution Comments: gave handout and reviewed, educated with demonstrateion and instruction during mobility Required Braces or Orthoses: Spinal Brace Spinal Brace: Lumbar corset;Applied in sitting position      Mobility  Bed Mobility               General bed mobility comments: patient up in chair, demonstrated sit to sidelying to supine for when pt ready to go back to bed after lunch  Transfers Overall transfer level: Needs assistance Equipment used: Rolling walker (2 wheeled) Transfers: Sit to/from Stand Sit to Stand: Min guard         General transfer comment: cues and increased time to scoot to edge of chair, cues after seated to scoot to back of chair and have support for improved sitting posture.  Ambulation/Gait Ambulation/Gait assistance: Min assist Ambulation Distance (Feet): 90 Feet Assistive device: Rolling walker (2 wheeled) Gait Pattern/deviations: Step-through pattern;Decreased stride length;Antalgic;Trunk flexed     General Gait Details: cues for staying inside  RW and for posture due to flexed  Stairs            Wheelchair Mobility    Modified Rankin (Stroke Patients Only)       Balance Overall balance assessment: Needs assistance         Standing balance support: No upper extremity supported;During functional activity Standing balance-Leahy Scale: Fair Standing balance comment: able to wash hands after toileting supervision                             Pertinent Vitals/Pain Pain Assessment: 0-10 Pain Score: 5  Pain Location: right hip and incision  Pain Descriptors / Indicators: Sore Pain Intervention(s): Monitored during session;Repositioned    Home Living Family/patient expects to be discharged to:: Private residence Living Arrangements: Spouse/significant other Available Help at Discharge: Family Type of Home: House Home Access: Stairs to enter Entrance Stairs-Rails: None Entrance Stairs-Number of Steps: 1 Home Layout: One level Home Equipment: Toilet riser;Walker - 2 wheels      Prior Function Level of Independence: Independent               Hand Dominance   Dominant Hand: Right    Extremity/Trunk Assessment               Lower Extremity Assessment: RLE deficits/detail;LLE deficits/detail RLE Deficits / Details: AROM grossly WFL strength grossly 4/5 LLE Deficits / Details: AROM grossly WFL strength grossly 4/5 except knee extension 4-/5     Communication   Communication: No difficulties  Cognition Arousal/Alertness: Awake/alert Behavior During Therapy: WFL for tasks assessed/performed Overall Cognitive  Status: Within Functional Limits for tasks assessed                      General Comments General comments (skin integrity, edema, etc.): spouse present throughout session and educated along with patient on precautions and plan for follow up PT    Exercises        Assessment/Plan    PT Assessment    PT Diagnosis Generalized weakness;Difficulty walking;Acute  pain   PT Problem List    PT Treatment Interventions     PT Goals (Current goals can be found in the Care Plan section) Acute Rehab PT Goals Patient Stated Goal: To go home PT Goal Formulation: With patient/family Time For Goal Achievement: 04/09/15 Potential to Achieve Goals: Good    Frequency     Barriers to discharge        Co-evaluation               End of Session Equipment Utilized During Treatment: Back brace Activity Tolerance: Patient tolerated treatment well Patient left: in chair;with call bell/phone within reach           Time: 1110-1148 PT Time Calculation (min) (ACUTE ONLY): 38 min   Charges:   PT Evaluation $Initial PT Evaluation Tier I: 1 Procedure PT Treatments $Gait Training: 8-22 mins $Self Care/Home Management: 8-22   PT G Codes:        Flynt Breeze,CYNDI 04/06/15, 12:48 PM Magda Kiel, [place] 04-06-2015

## 2015-04-02 NOTE — Progress Notes (Signed)
Patient ID: Joanne Nash, female   DOB: 1940-09-27, 74 y.o.   MRN: [id number] Subjective:  The patient is alert and pleasant. She looks well. She is in no apparent distress. Evidently her Hemovac drain became disconnected last night.  Objective: Vital signs in last 24 hours: Temp:  [98.1 F (36.7 C)-100.8 F (38.2 C)] 100.8 F (38.2 C) (07/15 0556) Pulse Rate:  [80-96] 96 (07/15 0556) Resp:  [15-38] 18 (07/15 0556) BP: (116-150)/(40-81) 136/44 mmHg (07/15 0556) SpO2:  [95 %-100 %] 95 % (07/15 0556)  Intake/Output from previous day: 07/14 0701 - 07/15 0700 In: 3870 [P.O.:120; I.V.:3150; Blood:100; IV Piggyback:500] Out: 2245 [SWHQP:5916; Drains:350; Blood:450] Intake/Output this shift:    Physical exam the patient is alert and pleasant. She is moving her lower extremities well. Her dressing is clean and dry.  Lab Results: No results for input(s): WBC, HGB, HCT, PLT in the last 72 hours. BMET No results for input(s): NA, K, CL, CO2, GLUCOSE, BUN, CREATININE, CALCIUM in the last 72 hours.  Studies/Results: Dg Lumbar Spine 2-3 Views  04/01/2015   CLINICAL DATA:  Posterior fixation screw placements with posterior lateral arthrodesis  EXAM: LUMBAR SPINE - 2-3 VIEW; DG C-ARM 61-120 MIN  COMPARISON:  Study obtained earlier in the day and February 23, 2015  FLUOROSCOPY TIME:  0 minutes 39 second ; two views acquired  FINDINGS: Frontal and lateral views obtained. Pedicle screws are noted at L4, L5, S1 traversing the respective pedicles with the tips of the respective screws in the respective vertebral bodies. There are disc spacers at L4-5 and L5-S1. There is persistent anterolisthesis of L4 on L5.  IMPRESSION: Pedicle screw placement bilaterally at L4, L5, and S1 as described. Stable anterolisthesis L4 on L5.   Electronically Signed   By: Lowella Grip III M.D.   On: 04/01/2015 15:04   Dg Lumbar Spine 1 View  04/01/2015   CLINICAL DATA:  Lumbar spine surgery.  EXAM: LUMBAR SPINE - 1 VIEW   COMPARISON:  02/23/2015 .  FINDINGS: Metallic markers noted posteriorly at the L5 level. Degenerative changes with anterolisthesis of L4 on L5 noted.  IMPRESSION: Metallic marker noted posteriorly at the L5 level.   Electronically Signed   By: Marcello Moores  Register   On: 04/01/2015 12:48   Dg C-arm 61-120 Min  04/01/2015   CLINICAL DATA:  Posterior fixation screw placements with posterior lateral arthrodesis  EXAM: LUMBAR SPINE - 2-3 VIEW; DG C-ARM 61-120 MIN  COMPARISON:  Study obtained earlier in the day and February 23, 2015  FLUOROSCOPY TIME:  0 minutes 39 second ; two views acquired  FINDINGS: Frontal and lateral views obtained. Pedicle screws are noted at L4, L5, S1 traversing the respective pedicles with the tips of the respective screws in the respective vertebral bodies. There are disc spacers at L4-5 and L5-S1. There is persistent anterolisthesis of L4 on L5.  IMPRESSION: Pedicle screw placement bilaterally at L4, L5, and S1 as described. Stable anterolisthesis L4 on L5.   Electronically Signed   By: Lowella Grip III M.D.   On: 04/01/2015 15:04    Assessment/Plan: Postop day #1: The patient is doing well. We will discontinue the Hemovac drain. She may go home this weekend.  LOS: 1 day     Krikor Willet D 04/02/2015, 7:47 AM

## 2015-04-03 MED ORDER — IBUPROFEN 200 MG PO TABS: 400.0000 mg | ORAL_TABLET | Freq: Once | ORAL | Status: DC

## 2015-04-03 NOTE — Progress Notes (Signed)
Physical Therapy Treatment Patient Details Name: Joanne Nash MRN: [id number] DOB: 08/13/1941 Today's Date: 04/03/2015    History of Present Illness Patient is a 74 year old white female admitted with spinal stenosis, spinal listhesis, etc. at L4-5 and L5-S1, now s/p L4-5 and L5-S1 decompression, instrumentation, and fusion.  PMH of arthritis with reverse TSA, R & L TKA (recent L 08/2014).    PT Comments    Patient with significant pain limiting session today. Patient requiring increased physical assist for bed mobility as well as transfers, limited ability to mobilize. Nsg made aware.  Follow Up Recommendations  Home health PT;Supervision/Assistance - 24 hour     Equipment Recommendations  None recommended by PT    Recommendations for Other Services       Precautions / Restrictions Precautions Precautions: Fall;Back Precaution Booklet Issued: Yes (comment) Required Braces or Orthoses: Spinal Brace Spinal Brace: Lumbar corset;Applied in sitting position Restrictions Weight Bearing Restrictions: No    Mobility  Bed Mobility Overal bed mobility: Needs Assistance Bed Mobility: Rolling;Sidelying to Sit;Sit to Sidelying Rolling: Mod assist Sidelying to sit: Mod assist     Sit to sidelying: Mod assist General bed mobility comments: Increased physical assist required for all aspects of bed mobility, patient limited by pain during all movements  Transfers Overall transfer level: Needs assistance Equipment used: Rolling walker (2 wheeled) Transfers: Sit to/from Stand Sit to Stand: Min assist         General transfer comment: Patient required increased physical assist to come to standing. patient required 2 attempts to elevate from bed despite assist and cues. Increased assist from toilet.  Ambulation/Gait Ambulation/Gait assistance: Min assist Ambulation Distance (Feet): 80 Feet Assistive device: Rolling walker (2 wheeled) Gait Pattern/deviations: Step-through  pattern;Decreased stride length;Antalgic;Trunk flexed Gait velocity: decreased Gait velocity interpretation: <1.8 ft/sec, indicative of risk for recurrent falls General Gait Details: assist for stability, cues for staying inside RW and for posture due to flexed positioning and increased pain.   Stairs            Wheelchair Mobility    Modified Rankin (Stroke Patients Only)       Balance Overall balance assessment: No apparent balance deficits (not formally assessed)         Standing balance support: No upper extremity supported Standing balance-Leahy Scale: Fair                      Cognition Arousal/Alertness: Awake/alert Behavior During Therapy: Flat affect Overall Cognitive Status: Within Functional Limits for tasks assessed                      Exercises      General Comments        Pertinent Vitals/Pain Pain Assessment: 0-10 Pain Score: 8  Pain Location: low back pain, incisional pain Pain Descriptors / Indicators: Sore Pain Intervention(s): Monitored during session;Repositioned    Home Living                      Prior Function            PT Goals (current goals can now be found in the care plan section) Acute Rehab PT Goals Patient Stated Goal: To go home PT Goal Formulation: With patient/family Time For Goal Achievement: 04/09/15 Potential to Achieve Goals: Good Progress towards PT goals: Not progressing toward goals - comment (mobilizing but significantly limited by pain)    Frequency  Min 5X/week    PT  Plan Current plan remains appropriate    Co-evaluation             End of Session Equipment Utilized During Treatment: Back brace Activity Tolerance: Patient tolerated treatment well Patient left: in bed;with call bell/phone within reach;with bed alarm set;with family/visitor present     Time: 1126-1150 PT Time Calculation (min) (ACUTE ONLY): 24 min  Charges:  $Gait Training: 8-22  mins $Therapeutic Activity: 8-22 mins                    G CodesDuncan Dull 04/05/15, 12:08 PM Alben Deeds, [place] DPT  [phone]

## 2015-04-03 NOTE — Progress Notes (Signed)
Subjective: Patient reports Doing well with no leg pain condition back soreness still slow to mobilize  Objective: Vital signs in last 24 hours: Temp:  [97.8 F (36.6 C)-100 F (37.8 C)] 99.1 F (37.3 C) (07/16 0622) Pulse Rate:  [88-117] 106 (07/16 0622) Resp:  [15-20] 16 (07/16 0622) BP: (112-145)/(38-56) 121/44 mmHg (07/16 0622) SpO2:  [82 %-97 %] 97 % (07/16 0622)  Intake/Output from previous day: 07/15 0701 - 07/16 0700 In: -  Out: 150 [Drains:150] Intake/Output this shift:    Strength out of 5 wound clean dry and intact  Lab Results:  Recent Labs  04/02/15 0812  WBC 8.7  HGB 9.4*  HCT 28.6*  PLT 110*   BMET  Recent Labs  04/02/15 0812  NA 137  K 3.8  CL 103  CO2 28  GLUCOSE 133*  BUN 7  CREATININE 0.65  CALCIUM 8.5*    Studies/Results: Dg Lumbar Spine 2-3 Views  04/01/2015   CLINICAL DATA:  Posterior fixation screw placements with posterior lateral arthrodesis  EXAM: LUMBAR SPINE - 2-3 VIEW; DG C-ARM 61-120 MIN  COMPARISON:  Study obtained earlier in the day and February 23, 2015  FLUOROSCOPY TIME:  0 minutes 39 second ; two views acquired  FINDINGS: Frontal and lateral views obtained. Pedicle screws are noted at L4, L5, S1 traversing the respective pedicles with the tips of the respective screws in the respective vertebral bodies. There are disc spacers at L4-5 and L5-S1. There is persistent anterolisthesis of L4 on L5.  IMPRESSION: Pedicle screw placement bilaterally at L4, L5, and S1 as described. Stable anterolisthesis L4 on L5.   Electronically Signed   By: Lowella Grip III M.D.   On: 04/01/2015 15:04   Dg Lumbar Spine 1 View  04/01/2015   CLINICAL DATA:  Lumbar spine surgery.  EXAM: LUMBAR SPINE - 1 VIEW  COMPARISON:  02/23/2015 .  FINDINGS: Metallic markers noted posteriorly at the L5 level. Degenerative changes with anterolisthesis of L4 on L5 noted.  IMPRESSION: Metallic marker noted posteriorly at the L5 level.   Electronically Signed   By: Marcello Moores   Register   On: 04/01/2015 12:48   Dg C-arm 61-120 Min  04/01/2015   CLINICAL DATA:  Posterior fixation screw placements with posterior lateral arthrodesis  EXAM: LUMBAR SPINE - 2-3 VIEW; DG C-ARM 61-120 MIN  COMPARISON:  Study obtained earlier in the day and February 23, 2015  FLUOROSCOPY TIME:  0 minutes 39 second ; two views acquired  FINDINGS: Frontal and lateral views obtained. Pedicle screws are noted at L4, L5, S1 traversing the respective pedicles with the tips of the respective screws in the respective vertebral bodies. There are disc spacers at L4-5 and L5-S1. There is persistent anterolisthesis of L4 on L5.  IMPRESSION: Pedicle screw placement bilaterally at L4, L5, and S1 as described. Stable anterolisthesis L4 on L5.   Electronically Signed   By: Lowella Grip III M.D.   On: 04/01/2015 15:04    Assessment/Plan: We'll continue physical occupational therapy to increase her independence  LOS: 2 days     Aggie Douse P 04/03/2015, 8:54 AM

## 2015-04-04 MED ORDER — OXYCODONE-ACETAMINOPHEN 5-325 MG PO TABS: 1.0000 | ORAL_TABLET | ORAL | Status: DC | PRN

## 2015-04-04 NOTE — Care Management Note (Addendum)
Case Management Note  Patient Details  Name: Joanne Nash MRN: [id number] Date of Birth: Nov 21, 1940  Subjective/Objective:                  L5 laminectomy with bilateral L4 laminotomies to decompress the bilateral L4, L5 and S1  Action/Plan:  Discharge home with HHPT  Expected Discharge Date: 04/05/15                 Expected Discharge Plan:     In-House Referral:     Discharge planning Services   HHPT vs Outpt PT  Post Acute Care Choice:    Choice offered to:   patient  DME Arranged:   n/a DME Agency:   n/a  HH Arranged:    [place] Agency:     Status of Service:     Medicare Important Message Given:    Date Medicare IM Given:    Medicare IM give by:    Date Additional Medicare IM Given:    Additional Medicare Important Message give by:     If discussed at [place] of Stay Meetings, dates discussed:    Additional Comments: Spoke to patient and husband about Md order for HHPT , patient stating would like to go [phone]...message left on answering machine for call back.  Discussed other home health agencies as a back up plan. Informed nurse of patient's wishes and a need for Outpt PT order, and to make physician aware that PT maynot be able to be set up today due to need for agencies to verify acceptance of  patient's insurance.   Explained to patient and husband that they may go home today and PT services will be set up tomorrow when acceptance of their insurance can be verified with the preferred agency.  That they will be called by the CM on duty tomorrow as to which agency, both husband and patient expressed understanding.     Joanne Nash, [place], RN 04/04/2015, 12:42 PM   Had return calls from [facility] and [facility] both accept patient's insurance.  Informed patient who would like to have [facility] if [facility] does not take her insurance.  Will  pass on to CM on duty on 04/05/15 to complete PT set up.

## 2015-04-04 NOTE — Progress Notes (Signed)
Patient ID: Joanne Nash, female   DOB: 03-21-1941, 74 y.o.   MRN: [id number] Doing very well pain well controlled  Neurologically stable  Discharge home

## 2015-04-04 NOTE — Discharge Instructions (Signed)
No lifting no bending no twisting no driving a riding a car unless she is coming back and forth to see Dr. Arnoldo Morale. Keep the incision clean dry and intact. May remove the outer dressing in 2-3 days cover with Saran wrap for showers only. If there are Steri-Strips in place leave those on until follow-up.

## 2015-04-04 NOTE — Progress Notes (Signed)
Physical Therapy Treatment Patient Details Name: Joanne Nash MRN: [id number] DOB: Jun 26, 1941 Today's Date: 04/04/2015    History of Present Illness Patient is a 74 year old white female admitted with spinal stenosis, spinal listhesis, etc. at L4-5 and L5-S1, now s/p L4-5 and L5-S1 decompression, instrumentation, and fusion.  PMH of arthritis with reverse TSA, R & L TKA (recent L 08/2014).    PT Comments      Follow Up Recommendations  Home health PT;Supervision/Assistance - 24 hour     Equipment Recommendations  None recommended by PT    Recommendations for Other Services       Precautions / Restrictions Precautions Precautions: Fall;Back Precaution Booklet Issued: Yes (comment) Required Braces or Orthoses: Spinal Brace Spinal Brace: Lumbar corset;Applied in sitting position Restrictions Weight Bearing Restrictions: No    Mobility  Bed Mobility Overal bed mobility: Needs Assistance Bed Mobility: Rolling;Sit to Supine Rolling: Min assist       Sit to sidelying: Min assist General bed mobility comments: assist to lift LE's back into bed.  Cues to reinforce back precautions & technique.    Transfers Overall transfer level: Needs assistance Equipment used: Rolling walker (2 wheeled) Transfers: Sit to/from Stand Sit to Stand: Min guard         General transfer comment: cues to reinforce hand placement  Ambulation/Gait Ambulation/Gait assistance: Min guard Ambulation Distance (Feet): 100 Feet Assistive device: Rolling walker (2 wheeled) Gait Pattern/deviations: Decreased step length - right;Decreased step length - left;Decreased stride length;Antalgic Gait velocity: decreased   General Gait Details: Guarding for safety due to pt mildly unsteady but no LOB noted.  Antalgic gait with decreased step length B.    Stairs            Wheelchair Mobility    Modified Rankin (Stroke Patients Only)       Balance                                     Cognition Arousal/Alertness: Awake/alert Behavior During Therapy: WFL for tasks assessed/performed Overall Cognitive Status: Within Functional Limits for tasks assessed                      Exercises      General Comments        Pertinent Vitals/Pain Pain Assessment: 0-10 Pain Score: 5  Pain Location: incisional site Pain Descriptors / Indicators: Discomfort Pain Intervention(s): Monitored during session;Premedicated before session;Repositioned    Home Living                      Prior Function            PT Goals (current goals can now be found in the care plan section) Acute Rehab PT Goals Patient Stated Goal: To go home PT Goal Formulation: With patient/family Time For Goal Achievement: 04/09/15 Potential to Achieve Goals: Good Progress towards PT goals: Progressing toward goals    Frequency  Min 5X/week    PT Plan Current plan remains appropriate    Co-evaluation             End of Session Equipment Utilized During Treatment: Back brace Activity Tolerance: Patient tolerated treatment well Patient left: in bed;with call bell/phone within reach     Time: 0945-1009 PT Time Calculation (min) (ACUTE ONLY): 24 min  Charges:  $Gait Training: 8-22 mins $Therapeutic Activity: 8-22 mins  G Codes:      Sena Hitch 04/04/2015, 12:32 PM   Sarajane Marek, PTA [phone] 04/04/2015

## 2015-04-04 NOTE — Progress Notes (Signed)
Discharge orders received. Pt and husband educated on discharge instructions. Pt verbalized understanding. Pt given discharge packet and prescription. IV removed. Writer informed by case management that pt was requesting outpatient PT. MD notified and stated that pt was still able to discharge today. MD gave verbal order to writer over phone for outpatient PT. CM and pt notified. Pt taken downstairs by staff via wheelchair.

## 2015-04-04 NOTE — Discharge Summary (Signed)
Physician Discharge Summary  Patient ID: Joanne Nash MRN: [id number] DOB/AGE: 12/28/1940 74 y.o.  Admit date: 04/01/2015 Discharge date: 04/04/2015  Admission Diagnoses: Lumbar spinal stenosis and spondylolisthesis L4-5 L5-S1  Discharge Diagnoses: Same Active Problems:   Spondylolisthesis of lumbar region   Discharged Condition: good  Hospital Course: Patient is admitted hospital underwent decompressive laminectomy and fusion L4-5 L5-S1. Postop the patient did very well recovered in the floor on the floor she was angling and voiding spontaneously tolerating regular diet and stable for discharge home posterior day 3. Patient be discharged her scheduled follow-up in Dr. Arnoldo Morale.  Consults: Significant Diagnostic Studies: Treatments: Decompression fusion L4-5 L5-S1 Discharge Exam: Blood pressure 124/43, pulse 97, temperature 99.9 F (37.7 C), temperature source Oral, resp. rate 16, height 5\' 2"  (1.575 m), weight 80.372 kg (177 lb 3 oz), SpO2 98 %. Strength 5 out of 5 wound clean dry and intact  Disposition: Home  Discharge Instructions    Face-to-face encounter (required for Medicare/Medicaid patients)    Complete by:  As directed   I Hedda Crumbley P certify that this patient is under my care and that I, or a nurse practitioner or physician's assistant working with me, had a face-to-face encounter that meets the physician face-to-face encounter requirements with this patient on 04/04/2015. The encounter with the patient was in whole, or in part for the following medical condition(s) which is the primary reason for home health care (List medical condition): lumbar astenosis  The encounter with the patient was in whole, or in part, for the following medical condition, which is the primary reason for home health care:  lumbar stenosis  I certify that, based on my findings, the following services are medically necessary home health services:  Physical therapy  Reason for Medically Necessary Home  Health Services:  Therapy- Therapeutic Exercises to Increase Strength and Endurance  My clinical findings support the need for the above services:  Unable to leave home safely without assistance and/or assistive device  Further, I certify that my clinical findings support that this patient is homebound due to:  Pain interferes with ambulation/mobility     Home Health    Complete by:  As directed   To provide the following care/treatments:  PT            Medication List    TAKE these medications        BIOTIN PO  Take 1 tablet by mouth daily.     CALCIUM PO  Take 2 tablets by mouth 2 (two) times daily.     cycloSPORINE 0.05 % ophthalmic emulsion  Commonly known as:  RESTASIS  Place 1 drop into both eyes 2 (two) times daily.     HYDROcodone-acetaminophen 5-325 MG per tablet  Commonly known as:  NORCO/VICODIN  Take 1-2 tablets by mouth every 4 (four) hours as needed.     levothyroxine 88 MCG tablet  Commonly known as:  SYNTHROID, LEVOTHROID  Take 88 mcg by mouth daily.     meloxicam 7.5 MG tablet  Commonly known as:  MOBIC  Take 7.5 mg by mouth 2 (two) times daily.     multivitamin with minerals Tabs tablet  Take 1 tablet by mouth daily.     omeprazole 20 MG capsule  Commonly known as:  PRILOSEC  Take 20 mg by mouth daily.     oxyCODONE-acetaminophen 5-325 MG per tablet  Commonly known as:  PERCOCET/ROXICET  Take 1-2 tablets by mouth every 4 (four) hours as needed for moderate pain.  PRESERVISION AREDS 2 PO  Take 1 capsule by mouth 2 (two) times daily.     simvastatin 20 MG tablet  Commonly known as:  ZOCOR  Take 20 mg by mouth every evening.     VITAMIN D PO  Take 1 tablet by mouth 2 (two) times daily.           Follow-up Information    Follow up with Ophelia Charter, MD.   Specialty:  Neurosurgery   Contact information:   [address] [place] [address] [phone]       Signed: Elaina Hoops 04/04/2015, 8:55 AM

## 2015-04-05 ENCOUNTER — Encounter (HOSPITAL_COMMUNITY): Payer: Self-pay | Admitting: Neurosurgery

## 2015-04-05 NOTE — Progress Notes (Signed)
Received a message that patient needed set up for home health PT and was requesting [facility], if possible.  [facility] is an outpatient facility and the orders are written for home health. CM called patient at home [phone] to explain that a home health agency would need to be selected.  CM made patient aware that if she wanted to pursue PT in the outpatient setting, she would need to contact the Dr's office for medical clearance and orders.  Patient is agreeable to home health and has chosen [place].  Voicemail was left for [facility] with Arville Go regarding the referral.

## 2015-04-05 NOTE — Progress Notes (Signed)
Received a call from [facility] with Joanne Nash, who has accepted the referral and will contact the patient.

## 2015-07-29 ENCOUNTER — Other Ambulatory Visit: Payer: Self-pay | Admitting: Neurosurgery

## 2015-07-29 DIAGNOSIS — M25552 Pain in left hip: Secondary | ICD-10-CM

## 2015-08-18 ENCOUNTER — Ambulatory Visit
Admission: RE | Admit: 2015-08-18 | Discharge: 2015-08-18 | Disposition: A | Discharge status: Home / Self Care | Payer: PPO | Source: Ambulatory Visit | Attending: Neurosurgery | Admitting: Neurosurgery

## 2015-08-18 DIAGNOSIS — K429 Umbilical hernia without obstruction or gangrene: Secondary | ICD-10-CM | POA: Diagnosis not present

## 2015-08-18 DIAGNOSIS — M25552 Pain in left hip: Secondary | ICD-10-CM | POA: Diagnosis present

## 2015-08-18 DIAGNOSIS — S76012A Strain of muscle, fascia and tendon of left hip, initial encounter: Secondary | ICD-10-CM | POA: Insufficient documentation

## 2015-09-23 DIAGNOSIS — M76899 Other specified enthesopathies of unspecified lower limb, excluding foot: Secondary | ICD-10-CM | POA: Diagnosis not present

## 2015-10-05 DIAGNOSIS — M25552 Pain in left hip: Secondary | ICD-10-CM | POA: Diagnosis not present

## 2015-10-07 DIAGNOSIS — M25552 Pain in left hip: Secondary | ICD-10-CM | POA: Diagnosis not present

## 2015-10-11 DIAGNOSIS — M25552 Pain in left hip: Secondary | ICD-10-CM | POA: Diagnosis not present

## 2015-10-14 DIAGNOSIS — M25552 Pain in left hip: Secondary | ICD-10-CM | POA: Diagnosis not present

## 2015-10-18 DIAGNOSIS — M25552 Pain in left hip: Secondary | ICD-10-CM | POA: Diagnosis not present

## 2015-10-20 DIAGNOSIS — M25552 Pain in left hip: Secondary | ICD-10-CM | POA: Diagnosis not present

## 2015-10-22 ENCOUNTER — Other Ambulatory Visit: Payer: Self-pay | Admitting: Neurosurgery

## 2015-10-22 ENCOUNTER — Ambulatory Visit
Admission: RE | Admit: 2015-10-22 | Discharge: 2015-10-22 | Disposition: A | Discharge status: Home / Self Care | Payer: PPO | Source: Ambulatory Visit | Attending: Neurosurgery | Admitting: Neurosurgery

## 2015-10-22 DIAGNOSIS — M4316 Spondylolisthesis, lumbar region: Secondary | ICD-10-CM | POA: Insufficient documentation

## 2015-10-22 DIAGNOSIS — Z9889 Other specified postprocedural states: Secondary | ICD-10-CM | POA: Insufficient documentation

## 2015-10-22 DIAGNOSIS — Z6832 Body mass index (BMI) 32.0-32.9, adult: Secondary | ICD-10-CM | POA: Diagnosis not present

## 2015-10-22 DIAGNOSIS — R030 Elevated blood-pressure reading, without diagnosis of hypertension: Secondary | ICD-10-CM | POA: Diagnosis not present

## 2015-10-22 DIAGNOSIS — M545 Low back pain: Secondary | ICD-10-CM | POA: Diagnosis not present

## 2015-10-22 DIAGNOSIS — M4327 Fusion of spine, lumbosacral region: Secondary | ICD-10-CM | POA: Diagnosis not present

## 2015-10-25 DIAGNOSIS — M25552 Pain in left hip: Secondary | ICD-10-CM | POA: Diagnosis not present

## 2015-10-27 DIAGNOSIS — M25552 Pain in left hip: Secondary | ICD-10-CM | POA: Diagnosis not present

## 2015-10-28 ENCOUNTER — Other Ambulatory Visit: Payer: Self-pay | Admitting: Neurosurgery

## 2015-10-28 DIAGNOSIS — G8929 Other chronic pain: Secondary | ICD-10-CM

## 2015-10-28 DIAGNOSIS — M545 Low back pain: Principal | ICD-10-CM

## 2015-11-01 DIAGNOSIS — M25552 Pain in left hip: Secondary | ICD-10-CM | POA: Diagnosis not present

## 2015-11-03 DIAGNOSIS — M25552 Pain in left hip: Secondary | ICD-10-CM | POA: Diagnosis not present

## 2015-11-04 DIAGNOSIS — G8929 Other chronic pain: Secondary | ICD-10-CM | POA: Diagnosis not present

## 2015-11-04 DIAGNOSIS — M25552 Pain in left hip: Secondary | ICD-10-CM | POA: Diagnosis not present

## 2015-11-22 ENCOUNTER — Ambulatory Visit
Admission: RE | Admit: 2015-11-22 | Discharge: 2015-11-22 | Disposition: A | Discharge status: Home / Self Care | Payer: PPO | Source: Ambulatory Visit | Attending: Neurosurgery | Admitting: Neurosurgery

## 2015-11-22 VITALS — BP 162/64 | HR 52

## 2015-11-22 DIAGNOSIS — M4316 Spondylolisthesis, lumbar region: Secondary | ICD-10-CM

## 2015-11-22 DIAGNOSIS — M5136 Other intervertebral disc degeneration, lumbar region: Secondary | ICD-10-CM | POA: Diagnosis not present

## 2015-11-22 DIAGNOSIS — G8929 Other chronic pain: Secondary | ICD-10-CM

## 2015-11-22 DIAGNOSIS — M545 Low back pain: Secondary | ICD-10-CM

## 2015-11-22 MED ORDER — IOHEXOL 180 MG/ML  SOLN
15.0000 mL | Freq: Once | INTRAMUSCULAR | Status: AC | PRN
  Administered 2015-11-22: 15 mL via INTRATHECAL

## 2015-11-22 MED ORDER — DIAZEPAM 5 MG PO TABS
5.0000 mg | ORAL_TABLET | Freq: Once | ORAL | Status: AC
  Administered 2015-11-22: 5 mg via ORAL

## 2015-11-22 NOTE — Discharge Instructions (Signed)

## 2015-11-30 DIAGNOSIS — E034 Atrophy of thyroid (acquired): Secondary | ICD-10-CM | POA: Diagnosis not present

## 2015-12-07 DIAGNOSIS — G4733 Obstructive sleep apnea (adult) (pediatric): Secondary | ICD-10-CM | POA: Diagnosis not present

## 2015-12-07 DIAGNOSIS — Z9989 Dependence on other enabling machines and devices: Secondary | ICD-10-CM | POA: Diagnosis not present

## 2015-12-07 DIAGNOSIS — E034 Atrophy of thyroid (acquired): Secondary | ICD-10-CM | POA: Diagnosis not present

## 2015-12-14 DIAGNOSIS — M25552 Pain in left hip: Secondary | ICD-10-CM | POA: Diagnosis not present

## 2015-12-14 DIAGNOSIS — M5416 Radiculopathy, lumbar region: Secondary | ICD-10-CM | POA: Diagnosis not present

## 2015-12-14 DIAGNOSIS — G8929 Other chronic pain: Secondary | ICD-10-CM | POA: Diagnosis not present

## 2015-12-21 DIAGNOSIS — H353132 Nonexudative age-related macular degeneration, bilateral, intermediate dry stage: Secondary | ICD-10-CM | POA: Diagnosis not present

## 2016-01-18 DIAGNOSIS — M5416 Radiculopathy, lumbar region: Secondary | ICD-10-CM | POA: Diagnosis not present

## 2016-02-28 DIAGNOSIS — Z85828 Personal history of other malignant neoplasm of skin: Secondary | ICD-10-CM | POA: Diagnosis not present

## 2016-02-28 DIAGNOSIS — Z08 Encounter for follow-up examination after completed treatment for malignant neoplasm: Secondary | ICD-10-CM | POA: Diagnosis not present

## 2016-02-28 DIAGNOSIS — D1721 Benign lipomatous neoplasm of skin and subcutaneous tissue of right arm: Secondary | ICD-10-CM | POA: Diagnosis not present

## 2016-02-28 DIAGNOSIS — L570 Actinic keratosis: Secondary | ICD-10-CM | POA: Diagnosis not present

## 2016-02-28 DIAGNOSIS — L821 Other seborrheic keratosis: Secondary | ICD-10-CM | POA: Diagnosis not present

## 2016-02-28 DIAGNOSIS — X32XXXA Exposure to sunlight, initial encounter: Secondary | ICD-10-CM | POA: Diagnosis not present

## 2016-03-07 DIAGNOSIS — M545 Low back pain: Secondary | ICD-10-CM | POA: Diagnosis not present

## 2016-06-05 DIAGNOSIS — E034 Atrophy of thyroid (acquired): Secondary | ICD-10-CM | POA: Diagnosis not present

## 2016-06-05 DIAGNOSIS — E7800 Pure hypercholesterolemia, unspecified: Secondary | ICD-10-CM | POA: Diagnosis not present

## 2016-06-12 DIAGNOSIS — Z9989 Dependence on other enabling machines and devices: Secondary | ICD-10-CM | POA: Diagnosis not present

## 2016-06-12 DIAGNOSIS — E7800 Pure hypercholesterolemia, unspecified: Secondary | ICD-10-CM | POA: Diagnosis not present

## 2016-06-12 DIAGNOSIS — M810 Age-related osteoporosis without current pathological fracture: Secondary | ICD-10-CM | POA: Diagnosis not present

## 2016-06-12 DIAGNOSIS — E034 Atrophy of thyroid (acquired): Secondary | ICD-10-CM | POA: Diagnosis not present

## 2016-06-12 DIAGNOSIS — Z23 Encounter for immunization: Secondary | ICD-10-CM | POA: Diagnosis not present

## 2016-06-12 DIAGNOSIS — Z0001 Encounter for general adult medical examination with abnormal findings: Secondary | ICD-10-CM | POA: Diagnosis not present

## 2016-06-12 DIAGNOSIS — G4733 Obstructive sleep apnea (adult) (pediatric): Secondary | ICD-10-CM | POA: Diagnosis not present

## 2016-06-12 DIAGNOSIS — Z1231 Encounter for screening mammogram for malignant neoplasm of breast: Secondary | ICD-10-CM | POA: Diagnosis not present

## 2016-06-13 ENCOUNTER — Other Ambulatory Visit: Payer: Self-pay | Admitting: Internal Medicine

## 2016-06-13 DIAGNOSIS — Z1231 Encounter for screening mammogram for malignant neoplasm of breast: Secondary | ICD-10-CM

## 2016-06-21 DIAGNOSIS — H353132 Nonexudative age-related macular degeneration, bilateral, intermediate dry stage: Secondary | ICD-10-CM | POA: Diagnosis not present

## 2016-06-26 ENCOUNTER — Ambulatory Visit
Admission: RE | Admit: 2016-06-26 | Discharge: 2016-06-26 | Disposition: A | Discharge status: Home / Self Care | Payer: PPO | Source: Ambulatory Visit | Attending: Internal Medicine | Admitting: Internal Medicine

## 2016-06-26 DIAGNOSIS — Z1231 Encounter for screening mammogram for malignant neoplasm of breast: Secondary | ICD-10-CM | POA: Diagnosis not present

## 2016-06-30 DIAGNOSIS — M7062 Trochanteric bursitis, left hip: Secondary | ICD-10-CM | POA: Diagnosis not present

## 2016-07-17 DIAGNOSIS — Z0000 Encounter for general adult medical examination without abnormal findings: Secondary | ICD-10-CM | POA: Diagnosis not present

## 2016-08-29 DIAGNOSIS — M545 Low back pain: Secondary | ICD-10-CM | POA: Diagnosis not present

## 2016-11-29 DIAGNOSIS — H6123 Impacted cerumen, bilateral: Secondary | ICD-10-CM | POA: Diagnosis not present

## 2016-11-29 DIAGNOSIS — H903 Sensorineural hearing loss, bilateral: Secondary | ICD-10-CM | POA: Diagnosis not present

## 2016-12-04 DIAGNOSIS — E034 Atrophy of thyroid (acquired): Secondary | ICD-10-CM | POA: Diagnosis not present

## 2016-12-11 DIAGNOSIS — Z0000 Encounter for general adult medical examination without abnormal findings: Secondary | ICD-10-CM | POA: Diagnosis not present

## 2016-12-11 DIAGNOSIS — E034 Atrophy of thyroid (acquired): Secondary | ICD-10-CM | POA: Diagnosis not present

## 2016-12-11 DIAGNOSIS — Z9989 Dependence on other enabling machines and devices: Secondary | ICD-10-CM | POA: Diagnosis not present

## 2016-12-11 DIAGNOSIS — G4733 Obstructive sleep apnea (adult) (pediatric): Secondary | ICD-10-CM | POA: Diagnosis not present

## 2016-12-20 DIAGNOSIS — H353132 Nonexudative age-related macular degeneration, bilateral, intermediate dry stage: Secondary | ICD-10-CM | POA: Diagnosis not present

## 2017-02-23 DIAGNOSIS — M7062 Trochanteric bursitis, left hip: Secondary | ICD-10-CM | POA: Diagnosis not present

## 2017-02-23 DIAGNOSIS — M25552 Pain in left hip: Secondary | ICD-10-CM | POA: Diagnosis not present

## 2017-02-26 DIAGNOSIS — C44319 Basal cell carcinoma of skin of other parts of face: Secondary | ICD-10-CM | POA: Diagnosis not present

## 2017-02-26 DIAGNOSIS — D485 Neoplasm of uncertain behavior of skin: Secondary | ICD-10-CM | POA: Diagnosis not present

## 2017-02-26 DIAGNOSIS — Z08 Encounter for follow-up examination after completed treatment for malignant neoplasm: Secondary | ICD-10-CM | POA: Diagnosis not present

## 2017-02-26 DIAGNOSIS — L821 Other seborrheic keratosis: Secondary | ICD-10-CM | POA: Diagnosis not present

## 2017-02-26 DIAGNOSIS — D1721 Benign lipomatous neoplasm of skin and subcutaneous tissue of right arm: Secondary | ICD-10-CM | POA: Diagnosis not present

## 2017-02-26 DIAGNOSIS — Z85828 Personal history of other malignant neoplasm of skin: Secondary | ICD-10-CM | POA: Diagnosis not present

## 2017-03-07 DIAGNOSIS — M7062 Trochanteric bursitis, left hip: Secondary | ICD-10-CM | POA: Diagnosis not present

## 2017-03-07 DIAGNOSIS — M4726 Other spondylosis with radiculopathy, lumbar region: Secondary | ICD-10-CM | POA: Diagnosis not present

## 2017-03-12 DIAGNOSIS — M4726 Other spondylosis with radiculopathy, lumbar region: Secondary | ICD-10-CM | POA: Diagnosis not present

## 2017-03-14 DIAGNOSIS — M4726 Other spondylosis with radiculopathy, lumbar region: Secondary | ICD-10-CM | POA: Diagnosis not present

## 2017-03-19 DIAGNOSIS — M4726 Other spondylosis with radiculopathy, lumbar region: Secondary | ICD-10-CM | POA: Diagnosis not present

## 2017-03-26 DIAGNOSIS — M4726 Other spondylosis with radiculopathy, lumbar region: Secondary | ICD-10-CM | POA: Diagnosis not present

## 2017-03-27 DIAGNOSIS — C44319 Basal cell carcinoma of skin of other parts of face: Secondary | ICD-10-CM | POA: Diagnosis not present

## 2017-03-27 DIAGNOSIS — L905 Scar conditions and fibrosis of skin: Secondary | ICD-10-CM | POA: Diagnosis not present

## 2017-04-02 DIAGNOSIS — M4726 Other spondylosis with radiculopathy, lumbar region: Secondary | ICD-10-CM | POA: Diagnosis not present

## 2017-04-13 DIAGNOSIS — M545 Low back pain: Secondary | ICD-10-CM | POA: Diagnosis not present

## 2017-04-13 DIAGNOSIS — Z6833 Body mass index (BMI) 33.0-33.9, adult: Secondary | ICD-10-CM | POA: Diagnosis not present

## 2017-04-13 DIAGNOSIS — R030 Elevated blood-pressure reading, without diagnosis of hypertension: Secondary | ICD-10-CM | POA: Diagnosis not present

## 2017-06-07 DIAGNOSIS — E034 Atrophy of thyroid (acquired): Secondary | ICD-10-CM | POA: Diagnosis not present

## 2017-06-14 ENCOUNTER — Other Ambulatory Visit: Payer: Self-pay | Admitting: Internal Medicine

## 2017-06-14 DIAGNOSIS — G4733 Obstructive sleep apnea (adult) (pediatric): Secondary | ICD-10-CM | POA: Diagnosis not present

## 2017-06-14 DIAGNOSIS — Z9989 Dependence on other enabling machines and devices: Secondary | ICD-10-CM | POA: Diagnosis not present

## 2017-06-14 DIAGNOSIS — M810 Age-related osteoporosis without current pathological fracture: Secondary | ICD-10-CM | POA: Diagnosis not present

## 2017-06-14 DIAGNOSIS — E2839 Other primary ovarian failure: Secondary | ICD-10-CM | POA: Diagnosis not present

## 2017-06-14 DIAGNOSIS — E034 Atrophy of thyroid (acquired): Secondary | ICD-10-CM | POA: Diagnosis not present

## 2017-06-14 DIAGNOSIS — Z0001 Encounter for general adult medical examination with abnormal findings: Secondary | ICD-10-CM | POA: Diagnosis not present

## 2017-06-14 DIAGNOSIS — Z1231 Encounter for screening mammogram for malignant neoplasm of breast: Secondary | ICD-10-CM

## 2017-06-14 DIAGNOSIS — D696 Thrombocytopenia, unspecified: Secondary | ICD-10-CM | POA: Diagnosis not present

## 2017-06-14 DIAGNOSIS — E7800 Pure hypercholesterolemia, unspecified: Secondary | ICD-10-CM | POA: Diagnosis not present

## 2017-06-18 DIAGNOSIS — H353132 Nonexudative age-related macular degeneration, bilateral, intermediate dry stage: Secondary | ICD-10-CM | POA: Diagnosis not present

## 2017-06-26 DIAGNOSIS — M810 Age-related osteoporosis without current pathological fracture: Secondary | ICD-10-CM | POA: Diagnosis not present

## 2017-06-27 ENCOUNTER — Ambulatory Visit
Admission: RE | Admit: 2017-06-27 | Discharge: 2017-06-27 | Disposition: A | Discharge status: Home / Self Care | Payer: PPO | Source: Ambulatory Visit | Attending: Internal Medicine | Admitting: Internal Medicine

## 2017-06-27 DIAGNOSIS — Z1231 Encounter for screening mammogram for malignant neoplasm of breast: Secondary | ICD-10-CM | POA: Insufficient documentation

## 2017-07-03 DIAGNOSIS — L821 Other seborrheic keratosis: Secondary | ICD-10-CM | POA: Diagnosis not present

## 2017-07-03 DIAGNOSIS — Z08 Encounter for follow-up examination after completed treatment for malignant neoplasm: Secondary | ICD-10-CM | POA: Diagnosis not present

## 2017-07-03 DIAGNOSIS — Z85828 Personal history of other malignant neoplasm of skin: Secondary | ICD-10-CM | POA: Diagnosis not present

## 2017-07-03 DIAGNOSIS — D1721 Benign lipomatous neoplasm of skin and subcutaneous tissue of right arm: Secondary | ICD-10-CM | POA: Diagnosis not present

## 2017-07-12 DIAGNOSIS — M810 Age-related osteoporosis without current pathological fracture: Secondary | ICD-10-CM | POA: Diagnosis not present

## 2017-08-30 DIAGNOSIS — H02403 Unspecified ptosis of bilateral eyelids: Secondary | ICD-10-CM | POA: Diagnosis not present

## 2017-08-30 DIAGNOSIS — H02831 Dermatochalasis of right upper eyelid: Secondary | ICD-10-CM | POA: Diagnosis not present

## 2017-08-30 DIAGNOSIS — H02834 Dermatochalasis of left upper eyelid: Secondary | ICD-10-CM | POA: Diagnosis not present

## 2017-11-14 DIAGNOSIS — L089 Local infection of the skin and subcutaneous tissue, unspecified: Secondary | ICD-10-CM | POA: Diagnosis not present

## 2017-12-07 DIAGNOSIS — E034 Atrophy of thyroid (acquired): Secondary | ICD-10-CM | POA: Diagnosis not present

## 2017-12-14 DIAGNOSIS — Z5181 Encounter for therapeutic drug level monitoring: Secondary | ICD-10-CM | POA: Diagnosis not present

## 2017-12-14 DIAGNOSIS — M5136 Other intervertebral disc degeneration, lumbar region: Secondary | ICD-10-CM | POA: Diagnosis not present

## 2017-12-14 DIAGNOSIS — G4733 Obstructive sleep apnea (adult) (pediatric): Secondary | ICD-10-CM | POA: Diagnosis not present

## 2017-12-14 DIAGNOSIS — E034 Atrophy of thyroid (acquired): Secondary | ICD-10-CM | POA: Diagnosis not present

## 2017-12-14 DIAGNOSIS — E7800 Pure hypercholesterolemia, unspecified: Secondary | ICD-10-CM | POA: Diagnosis not present

## 2017-12-14 DIAGNOSIS — Z9989 Dependence on other enabling machines and devices: Secondary | ICD-10-CM | POA: Diagnosis not present

## 2017-12-17 DIAGNOSIS — H2513 Age-related nuclear cataract, bilateral: Secondary | ICD-10-CM | POA: Diagnosis not present

## 2017-12-17 DIAGNOSIS — H353132 Nonexudative age-related macular degeneration, bilateral, intermediate dry stage: Secondary | ICD-10-CM | POA: Diagnosis not present

## 2018-02-07 DIAGNOSIS — H02403 Unspecified ptosis of bilateral eyelids: Secondary | ICD-10-CM | POA: Diagnosis not present

## 2018-04-29 DIAGNOSIS — Z86010 Personal history of colonic polyps: Secondary | ICD-10-CM | POA: Diagnosis not present

## 2018-05-07 DIAGNOSIS — R030 Elevated blood-pressure reading, without diagnosis of hypertension: Secondary | ICD-10-CM | POA: Diagnosis not present

## 2018-05-07 DIAGNOSIS — M545 Low back pain: Secondary | ICD-10-CM | POA: Diagnosis not present

## 2018-05-07 DIAGNOSIS — Z6833 Body mass index (BMI) 33.0-33.9, adult: Secondary | ICD-10-CM | POA: Diagnosis not present

## 2018-05-07 DIAGNOSIS — M4317 Spondylolisthesis, lumbosacral region: Secondary | ICD-10-CM | POA: Diagnosis not present

## 2018-06-11 DIAGNOSIS — E7800 Pure hypercholesterolemia, unspecified: Secondary | ICD-10-CM | POA: Diagnosis not present

## 2018-06-11 DIAGNOSIS — Z5181 Encounter for therapeutic drug level monitoring: Secondary | ICD-10-CM | POA: Diagnosis not present

## 2018-06-11 DIAGNOSIS — E034 Atrophy of thyroid (acquired): Secondary | ICD-10-CM | POA: Diagnosis not present

## 2018-06-18 ENCOUNTER — Other Ambulatory Visit: Payer: Self-pay | Admitting: Internal Medicine

## 2018-06-18 DIAGNOSIS — Z9989 Dependence on other enabling machines and devices: Secondary | ICD-10-CM | POA: Diagnosis not present

## 2018-06-18 DIAGNOSIS — E7800 Pure hypercholesterolemia, unspecified: Secondary | ICD-10-CM | POA: Diagnosis not present

## 2018-06-18 DIAGNOSIS — Z0001 Encounter for general adult medical examination with abnormal findings: Secondary | ICD-10-CM | POA: Diagnosis not present

## 2018-06-18 DIAGNOSIS — E034 Atrophy of thyroid (acquired): Secondary | ICD-10-CM | POA: Diagnosis not present

## 2018-06-18 DIAGNOSIS — Z1231 Encounter for screening mammogram for malignant neoplasm of breast: Secondary | ICD-10-CM

## 2018-06-18 DIAGNOSIS — Z23 Encounter for immunization: Secondary | ICD-10-CM | POA: Diagnosis not present

## 2018-06-18 DIAGNOSIS — G4733 Obstructive sleep apnea (adult) (pediatric): Secondary | ICD-10-CM | POA: Diagnosis not present

## 2018-06-18 DIAGNOSIS — M5136 Other intervertebral disc degeneration, lumbar region: Secondary | ICD-10-CM | POA: Diagnosis not present

## 2018-06-18 DIAGNOSIS — Z0000 Encounter for general adult medical examination without abnormal findings: Secondary | ICD-10-CM | POA: Diagnosis not present

## 2018-07-02 ENCOUNTER — Ambulatory Visit
Admission: RE | Admit: 2018-07-02 | Discharge: 2018-07-02 | Disposition: A | Discharge status: Home / Self Care | Payer: PPO | Source: Ambulatory Visit | Attending: Internal Medicine | Admitting: Internal Medicine

## 2018-07-02 DIAGNOSIS — Z1231 Encounter for screening mammogram for malignant neoplasm of breast: Secondary | ICD-10-CM | POA: Insufficient documentation

## 2018-07-05 ENCOUNTER — Encounter: Payer: Self-pay | Admitting: *Deleted

## 2018-07-08 ENCOUNTER — Ambulatory Visit: Payer: PPO | Admitting: Anesthesiology

## 2018-07-08 ENCOUNTER — Encounter: Payer: Self-pay | Admitting: Anesthesiology

## 2018-07-08 ENCOUNTER — Ambulatory Visit
Admission: RE | Admit: 2018-07-08 | Discharge: 2018-07-08 | Disposition: A | Discharge status: Home / Self Care | Payer: PPO | Source: Ambulatory Visit | Attending: Unknown Physician Specialty | Admitting: Unknown Physician Specialty

## 2018-07-08 ENCOUNTER — Encounter
Admission: RE | Disposition: A | Discharge status: Home / Self Care | Payer: Self-pay | Source: Ambulatory Visit | Attending: Unknown Physician Specialty

## 2018-07-08 DIAGNOSIS — Z79899 Other long term (current) drug therapy: Secondary | ICD-10-CM | POA: Insufficient documentation

## 2018-07-08 DIAGNOSIS — Z8619 Personal history of other infectious and parasitic diseases: Secondary | ICD-10-CM | POA: Insufficient documentation

## 2018-07-08 DIAGNOSIS — Z791 Long term (current) use of non-steroidal anti-inflammatories (NSAID): Secondary | ICD-10-CM | POA: Insufficient documentation

## 2018-07-08 DIAGNOSIS — K635 Polyp of colon: Secondary | ICD-10-CM | POA: Insufficient documentation

## 2018-07-08 DIAGNOSIS — Z1211 Encounter for screening for malignant neoplasm of colon: Secondary | ICD-10-CM | POA: Insufficient documentation

## 2018-07-08 DIAGNOSIS — Z96653 Presence of artificial knee joint, bilateral: Secondary | ICD-10-CM | POA: Diagnosis not present

## 2018-07-08 DIAGNOSIS — E785 Hyperlipidemia, unspecified: Secondary | ICD-10-CM | POA: Insufficient documentation

## 2018-07-08 DIAGNOSIS — K5790 Diverticulosis of intestine, part unspecified, without perforation or abscess without bleeding: Secondary | ICD-10-CM | POA: Diagnosis not present

## 2018-07-08 DIAGNOSIS — Z96611 Presence of right artificial shoulder joint: Secondary | ICD-10-CM | POA: Insufficient documentation

## 2018-07-08 DIAGNOSIS — K640 First degree hemorrhoids: Secondary | ICD-10-CM | POA: Insufficient documentation

## 2018-07-08 DIAGNOSIS — D122 Benign neoplasm of ascending colon: Secondary | ICD-10-CM | POA: Diagnosis not present

## 2018-07-08 DIAGNOSIS — K648 Other hemorrhoids: Secondary | ICD-10-CM | POA: Diagnosis not present

## 2018-07-08 DIAGNOSIS — M810 Age-related osteoporosis without current pathological fracture: Secondary | ICD-10-CM | POA: Diagnosis not present

## 2018-07-08 DIAGNOSIS — Z9049 Acquired absence of other specified parts of digestive tract: Secondary | ICD-10-CM | POA: Diagnosis not present

## 2018-07-08 DIAGNOSIS — G4733 Obstructive sleep apnea (adult) (pediatric): Secondary | ICD-10-CM | POA: Insufficient documentation

## 2018-07-08 DIAGNOSIS — Z79890 Hormone replacement therapy (postmenopausal): Secondary | ICD-10-CM | POA: Diagnosis not present

## 2018-07-08 DIAGNOSIS — M1990 Unspecified osteoarthritis, unspecified site: Secondary | ICD-10-CM | POA: Diagnosis not present

## 2018-07-08 DIAGNOSIS — Z87891 Personal history of nicotine dependence: Secondary | ICD-10-CM | POA: Insufficient documentation

## 2018-07-08 DIAGNOSIS — K219 Gastro-esophageal reflux disease without esophagitis: Secondary | ICD-10-CM | POA: Insufficient documentation

## 2018-07-08 DIAGNOSIS — E039 Hypothyroidism, unspecified: Secondary | ICD-10-CM | POA: Insufficient documentation

## 2018-07-08 DIAGNOSIS — Z8371 Family history of colonic polyps: Secondary | ICD-10-CM | POA: Diagnosis not present

## 2018-07-08 DIAGNOSIS — Z86010 Personal history of colonic polyps: Secondary | ICD-10-CM | POA: Diagnosis not present

## 2018-07-08 HISTORY — PX: COLONOSCOPY WITH PROPOFOL: SHX5780

## 2018-07-08 HISTORY — DX: Asymptomatic varicose veins of bilateral lower extremities: I83.93

## 2018-07-08 HISTORY — DX: Personal history of other infectious and parasitic diseases: Z86.19

## 2018-07-08 HISTORY — DX: Age-related osteoporosis without current pathological fracture: M81.0

## 2018-07-08 HISTORY — DX: Granuloma annulare: L92.0

## 2018-07-08 HISTORY — DX: Polyp of colon: K63.5

## 2018-07-08 HISTORY — DX: Esophageal obstruction: K22.2

## 2018-07-08 SURGERY — COLONOSCOPY WITH PROPOFOL
Anesthesia: General

## 2018-07-08 MED ORDER — MIDAZOLAM HCL 2 MG/2ML IJ SOLN: INTRAMUSCULAR | Status: DC | PRN

## 2018-07-08 MED ORDER — PIPERACILLIN-TAZOBACTAM 3.375 G IVPB 30 MIN
3.3750 g | Freq: Once | INTRAVENOUS | Status: AC
  Administered 2018-07-08: 3.375 g via INTRAVENOUS
  Filled 2018-07-08: qty 50

## 2018-07-08 MED ORDER — SODIUM CHLORIDE 0.9 % IV SOLN: INTRAVENOUS | Status: DC

## 2018-07-08 MED ORDER — SODIUM CHLORIDE 0.9 % IV SOLN
INTRAVENOUS | Status: DC
  Administered 2018-07-08: 1000 mL via INTRAVENOUS

## 2018-07-08 MED ORDER — PROPOFOL 500 MG/50ML IV EMUL
INTRAVENOUS | Status: AC
Start: 2018-07-08 — End: 2018-07-08
  Filled 2018-07-08: qty 50

## 2018-07-08 MED ORDER — FENTANYL CITRATE (PF) 100 MCG/2ML IJ SOLN
INTRAMUSCULAR | Status: AC
  Filled 2018-07-08: qty 2

## 2018-07-08 MED ORDER — PIPERACILLIN-TAZOBACTAM 3.375 G IVPB
INTRAVENOUS | Status: AC
  Administered 2018-07-08: 3.375 g via INTRAVENOUS
  Filled 2018-07-08: qty 50

## 2018-07-08 MED ORDER — PROPOFOL 500 MG/50ML IV EMUL
INTRAVENOUS | Status: DC | PRN
  Administered 2018-07-08: 100 ug/kg/min via INTRAVENOUS

## 2018-07-08 MED ORDER — FENTANYL CITRATE (PF) 100 MCG/2ML IJ SOLN
INTRAMUSCULAR | Status: DC | PRN
  Administered 2018-07-08 (×2): 25 ug via INTRAVENOUS
  Administered 2018-07-08: 50 ug via INTRAVENOUS

## 2018-07-08 NOTE — Anesthesia Post-op Follow-up Note (Signed)
Anesthesia QCDR form completed.        

## 2018-07-08 NOTE — Transfer of Care (Signed)
Immediate Anesthesia Transfer of Care Note  Patient: Joanne Nash  Procedure(s) Performed: COLONOSCOPY WITH PROPOFOL (N/A )  Patient Location: PACU  Anesthesia Type:General  Level of Consciousness: awake and sedated  Airway & Oxygen Therapy: Patient Spontanous Breathing and Patient connected to nasal cannula oxygen  Post-op Assessment: Report given to RN and Post -op Vital signs reviewed and stable  Post vital signs: Reviewed and stable  Last Vitals:  Vitals Value Taken Time  BP    Temp    Pulse    Resp    SpO2      Last Pain:  Vitals:   07/08/18 1238  TempSrc: Tympanic  PainSc: 0-No pain         Complications: No apparent anesthesia complications

## 2018-07-08 NOTE — Anesthesia Procedure Notes (Signed)
Performed by: Cook-Martin, Jamorris Ndiaye Pre-anesthesia Checklist: Patient identified, Emergency Drugs available, Suction available, Patient being monitored and Timeout performed Patient Re-evaluated:Patient Re-evaluated prior to induction Oxygen Delivery Method: Nasal cannula Preoxygenation: Pre-oxygenation with 100% oxygen Induction Type: IV induction Ventilation: Oral airway inserted - appropriate to patient size Placement Confirmation: positive ETCO2 and CO2 detector       

## 2018-07-08 NOTE — Anesthesia Preprocedure Evaluation (Signed)
Anesthesia Evaluation  Patient identified by MRN, date of birth, ID band Patient awake    Reviewed: Allergy & Precautions, H&P , NPO status , reviewed documented beta blocker date and time   Airway Mallampati: II  TM Distance: >3 FB Neck ROM: full    Dental  (+) Implants, Partial Lower   Pulmonary sleep apnea , former smoker,    Pulmonary exam normal        Cardiovascular Normal cardiovascular exam     Neuro/Psych    GI/Hepatic GERD  Medicated and Controlled,  Endo/Other  Hypothyroidism   Renal/GU      Musculoskeletal  (+) Arthritis ,   Abdominal   Peds  Hematology   Anesthesia Other Findings Past Medical History: No date: Arthritis     Comment:  "probably all over; knees, shoulders, hands" (06/17/2012) No date: Colon polyps No date: Esophageal stricture No date: GERD (gastroesophageal reflux disease) No date: Granuloma annulare     Comment:  legs No date: History of shingles No date: Hyperlipemia No date: Hypothyroidism No date: Hypothyroidism No date: OSA on CPAP     Comment:  cpap 41yrs No date: Osteoporosis No date: Varicose veins of both lower extremities  Past Surgical History: No date: BACK SURGERY     Comment:  posterior fusion lumbar spine early 2000's: BREAST BIOPSY; Left     Comment:  benign ~ 1997: CARPAL TUNNEL RELEASE     Comment:  Left ~ 1996: CHOLECYSTECTOMY     Comment:  Lap No date: COLONOSCOPY 1980's?: DILATION AND CURETTAGE OF UTERUS No date: DILATION AND CURETTAGE OF UTERUS No date: ESOPHAGOGASTRODUODENOSCOPY 13,15: JOINT REPLACEMENT     Comment:  Right Knee, left 13: KNEE ARTHROPLASTY; Left 06/17/2012: REVERSE SHOULDER ARTHROPLASTY     Comment:  Procedure: REVERSE SHOULDER ARTHROPLASTY;  Surgeon:               Nita Sells, MD;  Location: [place];  Service:               Orthopedics;  Laterality: Right; 06/17/2012: REVERSE TOTAL SHOULDER ARTHROPLASTY     Comment:   right No date: sclerotherapy vein leg 02/2012: TOTAL KNEE ARTHROPLASTY     Comment:  right 1973: TUBAL LIGATION  BMI    Body Mass Index:  33.29 kg/m      Reproductive/Obstetrics                             Anesthesia Physical Anesthesia Plan  ASA: III  Anesthesia Plan: General   Post-op Pain Management:    Induction: Intravenous  PONV Risk Score and Plan: 3 and Treatment may vary due to age or medical condition and TIVA  Airway Management Planned: Nasal Cannula and Natural Airway  Additional Equipment:   Intra-op Plan:   Post-operative Plan:   Informed Consent: I have reviewed the patients History and Physical, chart, labs and discussed the procedure including the risks, benefits and alternatives for the proposed anesthesia with the patient or authorized representative who has indicated his/her understanding and acceptance.   Dental Advisory Given  Plan Discussed with: CRNA  Anesthesia Plan Comments:         Anesthesia Quick Evaluation

## 2018-07-08 NOTE — Op Note (Signed)
[facility] Patient Name: Joanne Nash Procedure Date: 07/08/2018 1:16 PM MRN: [id number] Account #: [id number] Date of Birth: 11-04-1940 Admit Type: Outpatient Age: 77 Room: [facility] ENDO ROOM 3 Gender: Female Note Status: Finalized Procedure:            Colonoscopy Indications:          Colon cancer screening in patient at increased risk:                        Family history of 1st-degree relative with colon polyps Providers:            Manya Silvas, MD Referring MD:         Baxter Hire, MD (Referring MD) Medicines:            Propofol per Anesthesia Complications:        No immediate complications. Procedure:            Pre-Anesthesia Assessment:                       - After reviewing the risks and benefits, the patient                        was deemed in satisfactory condition to undergo the                        procedure.                       After obtaining informed consent, the colonoscope was                        passed under direct vision. Throughout the procedure,                        the patient's blood pressure, pulse, and oxygen                        saturations were monitored continuously. The                        Colonoscope was introduced through the anus and                        advanced to the the cecum, identified by appendiceal                        orifice and ileocecal valve. The colonoscopy was                        somewhat difficult due to significant looping and a                        tortuous colon. The patient tolerated the procedure                        well. The quality of the bowel preparation was                        excellent. Findings:      A diminutive polyp was found in the ascending colon. The polyp was  sessile. The polyp was removed with a jumbo cold forceps. Resection and       retrieval were complete.      Internal hemorrhoids were found during endoscopy. The hemorrhoids  were       small and Grade I (internal hemorrhoids that do not prolapse).      The exam was otherwise without abnormality. Impression:           - One diminutive polyp in the ascending colon, removed                        with a jumbo cold forceps. Resected and retrieved.                       - Internal hemorrhoids.                       - The examination was otherwise normal. Recommendation:       - Await pathology results. Manya Silvas, MD 07/08/2018 1:48:51 PM This report has been signed electronically. Number of Addenda: 0 Note Initiated On: 07/08/2018 1:16 PM Scope Withdrawal Time: 0 hours 11 minutes 30 seconds  Total Procedure Duration: 0 hours 17 minutes 48 seconds       [facility]

## 2018-07-08 NOTE — H&P (Signed)
Primary Care Physician:  Baxter Hire, MD Primary Gastroenterologist:  Dr. Vira Agar  Pre-Procedure History & Physical: HPI:  Joanne Nash is a 77 y.o. female is here for an colonoscopy. Last one done  2014 no polyps seen.   Past Medical History:  Diagnosis Date  . Arthritis    "probably all over; knees, shoulders, hands" (06/17/2012)  . Colon polyps   . Esophageal stricture   . GERD (gastroesophageal reflux disease)   . Granuloma annulare    legs  . History of shingles   . Hyperlipemia   . Hypothyroidism   . Hypothyroidism   . OSA on CPAP    cpap 70yrs  . Osteoporosis   . Varicose veins of both lower extremities     Past Surgical History:  Procedure Laterality Date  . BACK SURGERY     posterior fusion lumbar spine  . BREAST BIOPSY Left early 2000's   benign  . CARPAL TUNNEL RELEASE  ~ 1997   Left  . CHOLECYSTECTOMY  ~ 1996   Lap  . COLONOSCOPY    . DILATION AND CURETTAGE OF UTERUS  1980's?  Marland Kitchen DILATION AND CURETTAGE OF UTERUS    . ESOPHAGOGASTRODUODENOSCOPY    . JOINT REPLACEMENT  13,15   Right Knee, left  . KNEE ARTHROPLASTY Left 13  . REVERSE SHOULDER ARTHROPLASTY  06/17/2012   Procedure: REVERSE SHOULDER ARTHROPLASTY;  Surgeon: Nita Sells, MD;  Location: [place];  Service: Orthopedics;  Laterality: Right;  . REVERSE TOTAL SHOULDER ARTHROPLASTY  06/17/2012   right  . sclerotherapy vein leg    . TOTAL KNEE ARTHROPLASTY  02/2012   right  . TUBAL LIGATION  1973    Prior to Admission medications   Medication Sig Start Date End Date Taking? Authorizing Provider  alendronate (FOSAMAX) 70 MG tablet Take 70 mg by mouth once a week. Take with a full glass of water on an empty stomach.   Yes [provider]  BIOTIN PO Take 1 tablet by mouth daily.   Yes [provider]  CALCIUM PO Take 2 tablets by mouth 2 (two) times daily.   Yes [provider]  Cholecalciferol (VITAMIN D PO) Take 1 tablet by mouth 2 (two) times daily.   Yes  [provider]  citalopram (CELEXA) 10 MG tablet Take 10 mg by mouth daily.   Yes [provider]  cycloSPORINE (RESTASIS) 0.05 % ophthalmic emulsion Place 1 drop into both eyes 2 (two) times daily.   Yes [provider]  gabapentin (NEURONTIN) 800 MG tablet Take 800 mg by mouth 3 (three) times daily.   Yes [provider]  HYDROcodone-acetaminophen (NORCO/VICODIN) 5-325 MG per tablet Take 1-2 tablets by mouth every 4 (four) hours as needed. Patient taking differently: Take 1-2 tablets by mouth every 4 (four) hours as needed (pain).  06/18/12  Yes Grier Mitts, PA-C  levothyroxine (SYNTHROID, LEVOTHROID) 88 MCG tablet Take 88 mcg by mouth daily.   Yes [provider]  meloxicam (MOBIC) 7.5 MG tablet Take 7.5 mg by mouth 2 (two) times daily.   Yes [provider]  Multiple Vitamin (MULTIVITAMIN WITH MINERALS) TABS Take 1 tablet by mouth daily.   Yes [provider]  Multiple Vitamins-Minerals (PRESERVISION AREDS 2 PO) Take 1 capsule by mouth 2 (two) times daily.   Yes [provider]  oxybutynin (DITROPAN) 5 MG tablet Take 5 mg by mouth 3 (three) times daily.   Yes [provider]  oxyCODONE-acetaminophen (PERCOCET/ROXICET) 5-325  MG per tablet Take 1-2 tablets by mouth every 4 (four) hours as needed for moderate pain. 04/04/15  Yes Kary Kos, MD  simvastatin (ZOCOR) 20 MG tablet Take 20 mg by mouth every evening.   Yes [provider]  omeprazole (PRILOSEC) 20 MG capsule Take 20 mg by mouth daily.    [provider]    Allergies as of 05/03/2018  . (No Known Allergies)    History reviewed. No pertinent family history.  Social History   Socioeconomic History  . Marital status: Married    Spouse name: Not on file  . Number of children: Not on file  . Years of education: Not on file  . Highest education level: Not on file  Occupational History  . Not on file  Social Needs  .  Financial resource strain: Not on file  . Food insecurity:    Worry: Not on file    Inability: Not on file  . Transportation needs:    Medical: Not on file    Non-medical: Not on file  Tobacco Use  . Smoking status: Former Smoker    Packs/day: 1.50    Years: 25.00    Pack years: 37.50    Types: Cigarettes    Last attempt to quit: 09/18/1980    Years since quitting: 37.8  . Smokeless tobacco: Never Used  Substance and Sexual Activity  . Alcohol use: No  . Drug use: No  . Sexual activity: Yes  Lifestyle  . Physical activity:    Days per week: Not on file    Minutes per session: Not on file  . Stress: Not on file  Relationships  . Social connections:    Talks on phone: Not on file    Gets together: Not on file    Attends religious service: Not on file    Active member of club or organization: Not on file    Attends meetings of clubs or organizations: Not on file    Relationship status: Not on file  . Intimate partner violence:    Fear of current or ex partner: Not on file    Emotionally abused: Not on file    Physically abused: Not on file    Forced sexual activity: Not on file  Other Topics Concern  . Not on file  Social History Narrative  . Not on file    Review of Systems: See HPI, otherwise negative ROS  Physical Exam: BP (!) 153/65   Pulse 77   Temp (!) 97.1 F (36.2 C) (Tympanic)   Resp 18   Ht 5\' 2"  (1.575 m)   Wt 82.6 kg   SpO2 96%   BMI 33.29 kg/m  General:   Alert,  pleasant and cooperative in NAD Head:  Normocephalic and atraumatic. Neck:  Supple; no masses or thyromegaly. Lungs:  Clear throughout to auscultation.    Heart:  Regular rate and rhythm. Abdomen:  Soft, nontender and nondistended. Normal bowel sounds, without guarding, and without rebound.   Neurologic:  Alert and  oriented x4;  grossly normal neurologically.  Impression/Plan: Joanne Nash is here for an colonoscopy to be performed for [facility] colon polyps.  Risks, benefits,  limitations, and alternatives regarding  colonoscopy have been reviewed with the patient.  Questions have been answered.  All parties agreeable.   Gaylyn Cheers, MD  07/08/2018, 1:18 PM

## 2018-07-09 ENCOUNTER — Encounter: Payer: Self-pay | Admitting: Unknown Physician Specialty

## 2018-07-10 LAB — SURGICAL PATHOLOGY

## 2018-07-10 NOTE — Anesthesia Postprocedure Evaluation (Signed)
Anesthesia Post Note  Patient: Joanne Nash  Procedure(s) Performed: COLONOSCOPY WITH PROPOFOL (N/A )  Patient location during evaluation: Endoscopy Anesthesia Type: General Level of consciousness: awake and alert Pain management: pain level controlled Vital Signs Assessment: post-procedure vital signs reviewed and stable Respiratory status: spontaneous breathing, nonlabored ventilation and respiratory function stable Cardiovascular status: blood pressure returned to baseline and stable Postop Assessment: no apparent nausea or vomiting Anesthetic complications: no     Last Vitals:  Vitals:   07/08/18 1359 07/08/18 1409  BP: 126/67 (!) 166/70  Pulse: 80 69  Resp: 18 17  Temp:    SpO2: 99% 100%    Last Pain:  Vitals:   07/08/18 1409  TempSrc:   PainSc: 0-No pain                 Alphonsus Sias

## 2018-07-11 DIAGNOSIS — G4733 Obstructive sleep apnea (adult) (pediatric): Secondary | ICD-10-CM | POA: Diagnosis not present

## 2018-07-14 DIAGNOSIS — G4733 Obstructive sleep apnea (adult) (pediatric): Secondary | ICD-10-CM | POA: Diagnosis not present

## 2018-07-23 DIAGNOSIS — D1721 Benign lipomatous neoplasm of skin and subcutaneous tissue of right arm: Secondary | ICD-10-CM | POA: Diagnosis not present

## 2018-07-23 DIAGNOSIS — L821 Other seborrheic keratosis: Secondary | ICD-10-CM | POA: Diagnosis not present

## 2018-07-23 DIAGNOSIS — Z08 Encounter for follow-up examination after completed treatment for malignant neoplasm: Secondary | ICD-10-CM | POA: Diagnosis not present

## 2018-07-23 DIAGNOSIS — Z85828 Personal history of other malignant neoplasm of skin: Secondary | ICD-10-CM | POA: Diagnosis not present

## 2018-12-04 DIAGNOSIS — M7062 Trochanteric bursitis, left hip: Secondary | ICD-10-CM | POA: Diagnosis not present

## 2018-12-04 DIAGNOSIS — M25552 Pain in left hip: Secondary | ICD-10-CM | POA: Diagnosis not present

## 2018-12-16 DIAGNOSIS — E034 Atrophy of thyroid (acquired): Secondary | ICD-10-CM | POA: Diagnosis not present

## 2018-12-23 DIAGNOSIS — N183 Chronic kidney disease, stage 3 (moderate): Secondary | ICD-10-CM | POA: Diagnosis not present

## 2018-12-23 DIAGNOSIS — Z13220 Encounter for screening for lipoid disorders: Secondary | ICD-10-CM | POA: Diagnosis not present

## 2018-12-23 DIAGNOSIS — Z96611 Presence of right artificial shoulder joint: Secondary | ICD-10-CM | POA: Diagnosis not present

## 2018-12-23 DIAGNOSIS — E559 Vitamin D deficiency, unspecified: Secondary | ICD-10-CM | POA: Diagnosis not present

## 2018-12-23 DIAGNOSIS — E781 Pure hyperglyceridemia: Secondary | ICD-10-CM | POA: Diagnosis not present

## 2018-12-23 DIAGNOSIS — I4891 Unspecified atrial fibrillation: Secondary | ICD-10-CM | POA: Diagnosis not present

## 2018-12-23 DIAGNOSIS — D696 Thrombocytopenia, unspecified: Secondary | ICD-10-CM | POA: Diagnosis not present

## 2018-12-23 DIAGNOSIS — Z0000 Encounter for general adult medical examination without abnormal findings: Secondary | ICD-10-CM | POA: Diagnosis not present

## 2018-12-23 DIAGNOSIS — E034 Atrophy of thyroid (acquired): Secondary | ICD-10-CM | POA: Diagnosis not present

## 2018-12-23 DIAGNOSIS — M25511 Pain in right shoulder: Secondary | ICD-10-CM | POA: Diagnosis not present

## 2018-12-23 DIAGNOSIS — I5022 Chronic systolic (congestive) heart failure: Secondary | ICD-10-CM | POA: Diagnosis not present

## 2018-12-23 DIAGNOSIS — E7800 Pure hypercholesterolemia, unspecified: Secondary | ICD-10-CM | POA: Diagnosis not present

## 2018-12-23 DIAGNOSIS — M810 Age-related osteoporosis without current pathological fracture: Secondary | ICD-10-CM | POA: Diagnosis not present

## 2018-12-23 DIAGNOSIS — M62521 Muscle wasting and atrophy, not elsewhere classified, right upper arm: Secondary | ICD-10-CM | POA: Diagnosis not present

## 2018-12-23 DIAGNOSIS — I129 Hypertensive chronic kidney disease with stage 1 through stage 4 chronic kidney disease, or unspecified chronic kidney disease: Secondary | ICD-10-CM | POA: Diagnosis not present

## 2019-03-24 DIAGNOSIS — X32XXXA Exposure to sunlight, initial encounter: Secondary | ICD-10-CM | POA: Diagnosis not present

## 2019-03-24 DIAGNOSIS — L570 Actinic keratosis: Secondary | ICD-10-CM | POA: Diagnosis not present

## 2019-04-08 DIAGNOSIS — H2513 Age-related nuclear cataract, bilateral: Secondary | ICD-10-CM | POA: Diagnosis not present

## 2019-04-15 DIAGNOSIS — M7062 Trochanteric bursitis, left hip: Secondary | ICD-10-CM | POA: Diagnosis not present

## 2019-05-06 DIAGNOSIS — Z6831 Body mass index (BMI) 31.0-31.9, adult: Secondary | ICD-10-CM | POA: Diagnosis not present

## 2019-05-06 DIAGNOSIS — M4317 Spondylolisthesis, lumbosacral region: Secondary | ICD-10-CM | POA: Diagnosis not present

## 2019-05-06 DIAGNOSIS — R030 Elevated blood-pressure reading, without diagnosis of hypertension: Secondary | ICD-10-CM | POA: Diagnosis not present

## 2019-05-30 ENCOUNTER — Other Ambulatory Visit: Payer: Self-pay | Admitting: Internal Medicine

## 2019-05-30 DIAGNOSIS — Z1231 Encounter for screening mammogram for malignant neoplasm of breast: Secondary | ICD-10-CM

## 2019-06-09 DIAGNOSIS — L810 Postinflammatory hyperpigmentation: Secondary | ICD-10-CM | POA: Diagnosis not present

## 2019-06-23 DIAGNOSIS — E034 Atrophy of thyroid (acquired): Secondary | ICD-10-CM | POA: Diagnosis not present

## 2019-06-23 DIAGNOSIS — Z13220 Encounter for screening for lipoid disorders: Secondary | ICD-10-CM | POA: Diagnosis not present

## 2019-06-30 DIAGNOSIS — E034 Atrophy of thyroid (acquired): Secondary | ICD-10-CM | POA: Diagnosis not present

## 2019-06-30 DIAGNOSIS — D696 Thrombocytopenia, unspecified: Secondary | ICD-10-CM | POA: Diagnosis not present

## 2019-06-30 DIAGNOSIS — Z0001 Encounter for general adult medical examination with abnormal findings: Secondary | ICD-10-CM | POA: Diagnosis not present

## 2019-06-30 DIAGNOSIS — E7800 Pure hypercholesterolemia, unspecified: Secondary | ICD-10-CM | POA: Diagnosis not present

## 2019-06-30 DIAGNOSIS — Z9989 Dependence on other enabling machines and devices: Secondary | ICD-10-CM | POA: Diagnosis not present

## 2019-06-30 DIAGNOSIS — G4733 Obstructive sleep apnea (adult) (pediatric): Secondary | ICD-10-CM | POA: Diagnosis not present

## 2019-07-04 ENCOUNTER — Ambulatory Visit
Admission: RE | Admit: 2019-07-04 | Discharge: 2019-07-04 | Disposition: A | Discharge status: Home / Self Care | Payer: PPO | Source: Ambulatory Visit | Attending: Internal Medicine | Admitting: Internal Medicine

## 2019-07-04 DIAGNOSIS — Z1231 Encounter for screening mammogram for malignant neoplasm of breast: Secondary | ICD-10-CM | POA: Diagnosis not present

## 2019-07-29 DIAGNOSIS — X32XXXA Exposure to sunlight, initial encounter: Secondary | ICD-10-CM | POA: Diagnosis not present

## 2019-07-29 DIAGNOSIS — D2261 Melanocytic nevi of right upper limb, including shoulder: Secondary | ICD-10-CM | POA: Diagnosis not present

## 2019-07-29 DIAGNOSIS — D2271 Melanocytic nevi of right lower limb, including hip: Secondary | ICD-10-CM | POA: Diagnosis not present

## 2019-07-29 DIAGNOSIS — D485 Neoplasm of uncertain behavior of skin: Secondary | ICD-10-CM | POA: Diagnosis not present

## 2019-07-29 DIAGNOSIS — L821 Other seborrheic keratosis: Secondary | ICD-10-CM | POA: Diagnosis not present

## 2019-07-29 DIAGNOSIS — Z85828 Personal history of other malignant neoplasm of skin: Secondary | ICD-10-CM | POA: Diagnosis not present

## 2019-07-29 DIAGNOSIS — D2262 Melanocytic nevi of left upper limb, including shoulder: Secondary | ICD-10-CM | POA: Diagnosis not present

## 2019-07-29 DIAGNOSIS — C44311 Basal cell carcinoma of skin of nose: Secondary | ICD-10-CM | POA: Diagnosis not present

## 2019-07-29 DIAGNOSIS — L570 Actinic keratosis: Secondary | ICD-10-CM | POA: Diagnosis not present

## 2019-07-29 DIAGNOSIS — L820 Inflamed seborrheic keratosis: Secondary | ICD-10-CM | POA: Diagnosis not present

## 2019-09-17 DIAGNOSIS — L570 Actinic keratosis: Secondary | ICD-10-CM | POA: Diagnosis not present

## 2019-09-17 DIAGNOSIS — X32XXXA Exposure to sunlight, initial encounter: Secondary | ICD-10-CM | POA: Diagnosis not present

## 2019-10-02 DIAGNOSIS — C44311 Basal cell carcinoma of skin of nose: Secondary | ICD-10-CM | POA: Diagnosis not present

## 2019-12-22 DIAGNOSIS — E034 Atrophy of thyroid (acquired): Secondary | ICD-10-CM | POA: Diagnosis not present

## 2019-12-29 DIAGNOSIS — Z0000 Encounter for general adult medical examination without abnormal findings: Secondary | ICD-10-CM | POA: Diagnosis not present

## 2019-12-29 DIAGNOSIS — G4733 Obstructive sleep apnea (adult) (pediatric): Secondary | ICD-10-CM | POA: Diagnosis not present

## 2019-12-29 DIAGNOSIS — D696 Thrombocytopenia, unspecified: Secondary | ICD-10-CM | POA: Diagnosis not present

## 2019-12-29 DIAGNOSIS — D225 Melanocytic nevi of trunk: Secondary | ICD-10-CM | POA: Diagnosis not present

## 2019-12-29 DIAGNOSIS — D2262 Melanocytic nevi of left upper limb, including shoulder: Secondary | ICD-10-CM | POA: Diagnosis not present

## 2019-12-29 DIAGNOSIS — E034 Atrophy of thyroid (acquired): Secondary | ICD-10-CM | POA: Diagnosis not present

## 2019-12-29 DIAGNOSIS — M810 Age-related osteoporosis without current pathological fracture: Secondary | ICD-10-CM | POA: Diagnosis not present

## 2019-12-29 DIAGNOSIS — L570 Actinic keratosis: Secondary | ICD-10-CM | POA: Diagnosis not present

## 2019-12-29 DIAGNOSIS — D2271 Melanocytic nevi of right lower limb, including hip: Secondary | ICD-10-CM | POA: Diagnosis not present

## 2019-12-29 DIAGNOSIS — E7800 Pure hypercholesterolemia, unspecified: Secondary | ICD-10-CM | POA: Diagnosis not present

## 2019-12-29 DIAGNOSIS — X32XXXA Exposure to sunlight, initial encounter: Secondary | ICD-10-CM | POA: Diagnosis not present

## 2019-12-29 DIAGNOSIS — L820 Inflamed seborrheic keratosis: Secondary | ICD-10-CM | POA: Diagnosis not present

## 2019-12-29 DIAGNOSIS — D2261 Melanocytic nevi of right upper limb, including shoulder: Secondary | ICD-10-CM | POA: Diagnosis not present

## 2019-12-29 DIAGNOSIS — Z9989 Dependence on other enabling machines and devices: Secondary | ICD-10-CM | POA: Diagnosis not present

## 2019-12-29 DIAGNOSIS — Z85828 Personal history of other malignant neoplasm of skin: Secondary | ICD-10-CM | POA: Diagnosis not present

## 2020-01-11 DIAGNOSIS — S50861A Insect bite (nonvenomous) of right forearm, initial encounter: Secondary | ICD-10-CM | POA: Diagnosis not present

## 2020-01-11 DIAGNOSIS — W57XXXA Bitten or stung by nonvenomous insect and other nonvenomous arthropods, initial encounter: Secondary | ICD-10-CM | POA: Diagnosis not present

## 2020-03-03 DIAGNOSIS — S76012D Strain of muscle, fascia and tendon of left hip, subsequent encounter: Secondary | ICD-10-CM | POA: Diagnosis not present

## 2020-03-03 DIAGNOSIS — G8929 Other chronic pain: Secondary | ICD-10-CM | POA: Diagnosis not present

## 2020-03-03 DIAGNOSIS — M25552 Pain in left hip: Secondary | ICD-10-CM | POA: Diagnosis not present

## 2020-03-03 DIAGNOSIS — M76892 Other specified enthesopathies of left lower limb, excluding foot: Secondary | ICD-10-CM | POA: Diagnosis not present

## 2020-03-03 DIAGNOSIS — M7062 Trochanteric bursitis, left hip: Secondary | ICD-10-CM | POA: Diagnosis not present

## 2020-03-31 DIAGNOSIS — S42201A Unspecified fracture of upper end of right humerus, initial encounter for closed fracture: Secondary | ICD-10-CM | POA: Diagnosis not present

## 2020-03-31 DIAGNOSIS — W010XXA Fall on same level from slipping, tripping and stumbling without subsequent striking against object, initial encounter: Secondary | ICD-10-CM | POA: Diagnosis not present

## 2020-03-31 DIAGNOSIS — S4991XA Unspecified injury of right shoulder and upper arm, initial encounter: Secondary | ICD-10-CM | POA: Diagnosis not present

## 2020-03-31 DIAGNOSIS — M9731XA Periprosthetic fracture around internal prosthetic right shoulder joint, initial encounter: Secondary | ICD-10-CM | POA: Diagnosis not present

## 2020-04-07 DIAGNOSIS — H353132 Nonexudative age-related macular degeneration, bilateral, intermediate dry stage: Secondary | ICD-10-CM | POA: Diagnosis not present

## 2020-04-09 DIAGNOSIS — Z96611 Presence of right artificial shoulder joint: Secondary | ICD-10-CM | POA: Diagnosis not present

## 2020-04-09 DIAGNOSIS — M979XXA Periprosthetic fracture around unspecified internal prosthetic joint, initial encounter: Secondary | ICD-10-CM | POA: Diagnosis not present

## 2020-04-09 DIAGNOSIS — M25511 Pain in right shoulder: Secondary | ICD-10-CM | POA: Diagnosis not present

## 2020-04-26 DIAGNOSIS — M979XXA Periprosthetic fracture around unspecified internal prosthetic joint, initial encounter: Secondary | ICD-10-CM | POA: Diagnosis not present

## 2020-04-26 DIAGNOSIS — M25511 Pain in right shoulder: Secondary | ICD-10-CM | POA: Diagnosis not present

## 2020-04-26 DIAGNOSIS — Z96611 Presence of right artificial shoulder joint: Secondary | ICD-10-CM | POA: Diagnosis not present

## 2020-05-11 DIAGNOSIS — Z6830 Body mass index (BMI) 30.0-30.9, adult: Secondary | ICD-10-CM | POA: Diagnosis not present

## 2020-05-11 DIAGNOSIS — R030 Elevated blood-pressure reading, without diagnosis of hypertension: Secondary | ICD-10-CM | POA: Diagnosis not present

## 2020-05-11 DIAGNOSIS — M4317 Spondylolisthesis, lumbosacral region: Secondary | ICD-10-CM | POA: Diagnosis not present

## 2020-05-17 DIAGNOSIS — M9731XD Periprosthetic fracture around internal prosthetic right shoulder joint, subsequent encounter: Secondary | ICD-10-CM | POA: Diagnosis not present

## 2020-05-31 DIAGNOSIS — M9731XD Periprosthetic fracture around internal prosthetic right shoulder joint, subsequent encounter: Secondary | ICD-10-CM | POA: Diagnosis not present

## 2020-06-01 DIAGNOSIS — M25611 Stiffness of right shoulder, not elsewhere classified: Secondary | ICD-10-CM | POA: Diagnosis not present

## 2020-06-08 DIAGNOSIS — M25611 Stiffness of right shoulder, not elsewhere classified: Secondary | ICD-10-CM | POA: Diagnosis not present

## 2020-06-10 ENCOUNTER — Other Ambulatory Visit: Payer: Self-pay | Admitting: Internal Medicine

## 2020-06-10 DIAGNOSIS — Z1231 Encounter for screening mammogram for malignant neoplasm of breast: Secondary | ICD-10-CM

## 2020-06-15 DIAGNOSIS — M25611 Stiffness of right shoulder, not elsewhere classified: Secondary | ICD-10-CM | POA: Diagnosis not present

## 2020-06-22 DIAGNOSIS — M25611 Stiffness of right shoulder, not elsewhere classified: Secondary | ICD-10-CM | POA: Diagnosis not present

## 2020-06-28 DIAGNOSIS — M9731XD Periprosthetic fracture around internal prosthetic right shoulder joint, subsequent encounter: Secondary | ICD-10-CM | POA: Diagnosis not present

## 2020-06-28 DIAGNOSIS — Z96611 Presence of right artificial shoulder joint: Secondary | ICD-10-CM | POA: Diagnosis not present

## 2020-06-28 DIAGNOSIS — S42254D Nondisplaced fracture of greater tuberosity of right humerus, subsequent encounter for fracture with routine healing: Secondary | ICD-10-CM | POA: Diagnosis not present

## 2020-06-29 DIAGNOSIS — M25611 Stiffness of right shoulder, not elsewhere classified: Secondary | ICD-10-CM | POA: Diagnosis not present

## 2020-07-05 ENCOUNTER — Other Ambulatory Visit: Payer: Self-pay

## 2020-07-05 ENCOUNTER — Ambulatory Visit
Admission: RE | Admit: 2020-07-05 | Discharge: 2020-07-05 | Disposition: A | Discharge status: Home / Self Care | Payer: PPO | Source: Ambulatory Visit | Attending: Internal Medicine | Admitting: Internal Medicine

## 2020-07-05 DIAGNOSIS — E034 Atrophy of thyroid (acquired): Secondary | ICD-10-CM | POA: Diagnosis not present

## 2020-07-05 DIAGNOSIS — Z1231 Encounter for screening mammogram for malignant neoplasm of breast: Secondary | ICD-10-CM | POA: Diagnosis not present

## 2020-07-05 DIAGNOSIS — E7800 Pure hypercholesterolemia, unspecified: Secondary | ICD-10-CM | POA: Diagnosis not present

## 2020-07-12 DIAGNOSIS — M810 Age-related osteoporosis without current pathological fracture: Secondary | ICD-10-CM | POA: Diagnosis not present

## 2020-07-12 DIAGNOSIS — E7800 Pure hypercholesterolemia, unspecified: Secondary | ICD-10-CM | POA: Diagnosis not present

## 2020-07-12 DIAGNOSIS — E034 Atrophy of thyroid (acquired): Secondary | ICD-10-CM | POA: Diagnosis not present

## 2020-07-12 DIAGNOSIS — Z23 Encounter for immunization: Secondary | ICD-10-CM | POA: Diagnosis not present

## 2020-07-12 DIAGNOSIS — D696 Thrombocytopenia, unspecified: Secondary | ICD-10-CM | POA: Diagnosis not present

## 2020-07-12 DIAGNOSIS — Z0001 Encounter for general adult medical examination with abnormal findings: Secondary | ICD-10-CM | POA: Diagnosis not present

## 2020-07-12 DIAGNOSIS — G4733 Obstructive sleep apnea (adult) (pediatric): Secondary | ICD-10-CM | POA: Diagnosis not present

## 2020-07-12 DIAGNOSIS — Z9989 Dependence on other enabling machines and devices: Secondary | ICD-10-CM | POA: Diagnosis not present

## 2020-08-02 ENCOUNTER — Ambulatory Visit: Payer: PPO | Attending: Internal Medicine

## 2020-08-02 DIAGNOSIS — Z23 Encounter for immunization: Secondary | ICD-10-CM

## 2020-08-02 NOTE — Progress Notes (Signed)
° °  Covid-19 Vaccination Clinic  Name:  Joanne Nash    MRN: [id number] DOB: August 14, 1941  08/02/2020  [name] was observed post Covid-19 immunization for 15 minutes without incident. She was provided with Vaccine Information Sheet and instruction to access the V-Safe system.   [name] was instructed to call 911 with any severe reactions post vaccine:  Difficulty breathing   Swelling of face and throat   A fast heartbeat   A bad rash all over body   Dizziness and weakness   Immunizations Administered    No immunizations on file.

## 2020-08-23 DIAGNOSIS — H6123 Impacted cerumen, bilateral: Secondary | ICD-10-CM | POA: Diagnosis not present

## 2020-08-23 DIAGNOSIS — H903 Sensorineural hearing loss, bilateral: Secondary | ICD-10-CM | POA: Diagnosis not present

## 2020-08-30 DIAGNOSIS — D2261 Melanocytic nevi of right upper limb, including shoulder: Secondary | ICD-10-CM | POA: Diagnosis not present

## 2020-08-30 DIAGNOSIS — Z85828 Personal history of other malignant neoplasm of skin: Secondary | ICD-10-CM | POA: Diagnosis not present

## 2020-08-30 DIAGNOSIS — D2271 Melanocytic nevi of right lower limb, including hip: Secondary | ICD-10-CM | POA: Diagnosis not present

## 2020-08-30 DIAGNOSIS — D2262 Melanocytic nevi of left upper limb, including shoulder: Secondary | ICD-10-CM | POA: Diagnosis not present

## 2020-08-30 DIAGNOSIS — H61032 Chondritis of left external ear: Secondary | ICD-10-CM | POA: Diagnosis not present

## 2020-08-30 DIAGNOSIS — D225 Melanocytic nevi of trunk: Secondary | ICD-10-CM | POA: Diagnosis not present

## 2020-08-30 DIAGNOSIS — L821 Other seborrheic keratosis: Secondary | ICD-10-CM | POA: Diagnosis not present

## 2020-10-19 ENCOUNTER — Other Ambulatory Visit: Payer: Self-pay

## 2020-10-19 ENCOUNTER — Ambulatory Visit
Admission: EM | Admit: 2020-10-19 | Discharge: 2020-10-19 | Disposition: A | Discharge status: Home / Self Care | Payer: PPO | Attending: Internal Medicine | Admitting: Internal Medicine

## 2020-10-19 DIAGNOSIS — H01001 Unspecified blepharitis right upper eyelid: Secondary | ICD-10-CM | POA: Diagnosis not present

## 2020-10-19 MED ORDER — ERYTHROMYCIN 5 MG/GM OP OINT: TOPICAL_OINTMENT | OPHTHALMIC | 0 refills | Status: DC

## 2020-10-19 NOTE — ED Provider Notes (Signed)
[facility]    CSN: [id number] Arrival date & time: 10/19/20  1032      History   Chief Complaint Chief Complaint  Patient presents with  . Eye Problem    HPI Joanne Nash is a 80 y.o. female who presents with L eye lid irritation and itching x 3 days. Had been tearing, but did not noticed purulent matter from it. Today feels better and is not as red as it had been on the lateral sclera region. Denies photophobia or eye ball pain or FB sensation.     Past Medical History:  Diagnosis Date  . Arthritis    "probably all over; knees, shoulders, hands" (06/17/2012)  . Colon polyps   . Esophageal stricture   . GERD (gastroesophageal reflux disease)   . Granuloma annulare    legs  . History of shingles   . Hyperlipemia   . Hypothyroidism   . Hypothyroidism   . OSA on CPAP    cpap 75yrs  . Osteoporosis   . Varicose veins of both lower extremities     Patient Active Problem List   Diagnosis Date Noted  . Spondylolisthesis of lumbar region 04/01/2015  . Glenohumeral arthritis 06/18/2012    Past Surgical History:  Procedure Laterality Date  . BACK SURGERY     posterior fusion lumbar spine  . BREAST BIOPSY Left early 2000's   benign  . CARPAL TUNNEL RELEASE  ~ 1997   Left  . CHOLECYSTECTOMY  ~ 1996   Lap  . COLONOSCOPY    . COLONOSCOPY WITH PROPOFOL N/A 07/08/2018   Procedure: COLONOSCOPY WITH PROPOFOL;  Surgeon: Manya Silvas, MD;  Location: [facility] ENDOSCOPY;  Service: Endoscopy;  Laterality: N/A;  . DILATION AND CURETTAGE OF UTERUS  1980's?  Marland Kitchen DILATION AND CURETTAGE OF UTERUS    . ESOPHAGOGASTRODUODENOSCOPY    . JOINT REPLACEMENT  13,15   Right Knee, left  . KNEE ARTHROPLASTY Left 13  . REVERSE SHOULDER ARTHROPLASTY  06/17/2012   Procedure: REVERSE SHOULDER ARTHROPLASTY;  Surgeon: Nita Sells, MD;  Location: [place];  Service: Orthopedics;  Laterality: Right;  . REVERSE TOTAL SHOULDER ARTHROPLASTY  06/17/2012   right  . sclerotherapy vein  leg    . TOTAL KNEE ARTHROPLASTY  02/2012   right  . TUBAL LIGATION  1973    OB History   No obstetric history on file.      Home Medications    Prior to Admission medications   Medication Sig Start Date End Date Taking? Authorizing Provider  citalopram (CELEXA) 10 MG tablet Take 10 mg by mouth daily.   Yes [provider]  cycloSPORINE (RESTASIS) 0.05 % ophthalmic emulsion Place 1 drop into both eyes 2 (two) times daily.   Yes [provider]  erythromycin ophthalmic ointment Place a 1/2 inch ribbon of ointment into the L lower eyelid tid x 7 days. 10/19/20  Yes Rodriguez-Southworth, Sunday Spillers, PA-C  gabapentin (NEURONTIN) 800 MG tablet Take 800 mg by mouth 3 (three) times daily.   Yes [provider]  levothyroxine (SYNTHROID, LEVOTHROID) 88 MCG tablet Take 88 mcg by mouth daily.   Yes [provider]  meloxicam (MOBIC) 7.5 MG tablet Take 7.5 mg by mouth 2 (two) times daily.   Yes [provider]  omeprazole (PRILOSEC) 20 MG capsule Take 20 mg by mouth daily.   Yes [provider]  oxybutynin (DITROPAN) 5 MG tablet Take 5 mg by mouth 3 (three) times daily.   Yes  [provider]  simvastatin (ZOCOR) 20 MG tablet Take 20 mg by mouth every evening.   Yes [provider]  BIOTIN PO Take 1 tablet by mouth daily.    [provider]  CALCIUM PO Take 2 tablets by mouth 2 (two) times daily.    [provider]  Cholecalciferol (VITAMIN D PO) Take 1 tablet by mouth 2 (two) times daily.    [provider]  Multiple Vitamin (MULTIVITAMIN WITH MINERALS) TABS Take 1 tablet by mouth daily.    [provider]  Multiple Vitamins-Minerals (PRESERVISION AREDS 2 PO) Take 1 capsule by mouth 2 (two) times daily.    [provider]    Family History History reviewed. No pertinent family history.  Social History Social History   Tobacco Use  . Smoking status: Former Smoker    Packs/day:  1.50    Years: 25.00    Pack years: 37.50    Types: Cigarettes    Quit date: 09/18/1980    Years since quitting: 40.1  . Smokeless tobacco: Never Used  Vaping Use  . Vaping Use: Never used  Substance Use Topics  . Alcohol use: No  . Drug use: No     Allergies   Patient has no known allergies.   Review of Systems Review of Systems  HENT: Negative for rhinorrhea.   Eyes: Positive for discharge, redness and itching. Negative for photophobia, pain and visual disturbance.  Musculoskeletal: Negative for gait problem.  Hematological: Negative for adenopathy.     Physical Exam Triage Vital Signs ED Triage Vitals  Enc Vitals Group     BP 10/19/20 1105 (!) 149/71     Pulse Rate 10/19/20 1105 68     Resp 10/19/20 1105 17     Temp 10/19/20 1105 98.1 F (36.7 C)     Temp Source 10/19/20 1105 Oral     SpO2 10/19/20 1105 97 %     Weight --      Height --      Head Circumference --      Peak Flow --      Pain Score 10/19/20 1102 0     Pain Loc --      Pain Edu? --      Excl. in [place]? --    No data found.  Updated Vital Signs BP (!) 149/71 (BP Location: Left Arm)   Pulse 68   Temp 98.1 F (36.7 C) (Oral)   Resp 17   SpO2 97%   Visual Acuity Right Eye Distance: 20/80 without her glasses Left Eye Distance: 20/80 without her glasses Bilateral Distance: 20/60 without her glasses  Right Eye Near:   Left Eye Near:    Bilateral Near:     Physical Exam Vitals and nursing note reviewed.  Constitutional:      General: She is not in acute distress.    Appearance: She is normal weight. She is not toxic-appearing.  HENT:     Right Ear: External ear normal.     Left Ear: External ear normal.  Eyes:     General: No scleral icterus.    Conjunctiva/sclera: Conjunctivae normal.     Comments: L Upper and lower lids looks erythematous, the upper is a little swollen and warm. The lower lid is not warm. Has mild lateral scleral injection, but no perilimbal erythema. Her L eye is  not tearing or having purulent matter.   Pulmonary:     Effort: Pulmonary effort is normal.  Musculoskeletal:  Cervical back: Neck supple.  Lymphadenopathy:     Cervical: No cervical adenopathy.  Skin:    General: Skin is warm and dry.  Neurological:     Mental Status: She is alert and oriented to person, place, and time.     Gait: Gait normal.  Psychiatric:        Mood and Affect: Mood normal.        Behavior: Behavior normal.        Thought Content: Thought content normal.        Judgment: Judgment normal.      UC Treatments / Results  Labs (all labs ordered are listed, but only abnormal results are displayed) Labs Reviewed - No data to display  EKG   Radiology No results found.  Procedures Procedures (including critical care time)  Medications Ordered in UC Medications - No data to display  Initial Impression / Assessment and Plan / UC Course  I have reviewed the triage vital signs and the nursing notes. Has mild scleritis and L lid blepharitis. I placed her on Erythromycin ophthalmic ointment as noted. Needs to Fu with her eye MD is she has signs of iritis which I reviewed with her in detail.   Final Clinical Impressions(s) / UC Diagnoses   Final diagnoses:  Blepharitis of right upper eyelid, unspecified type     Discharge Instructions     If your eye get more red around the color of your eye, light sensitivity and tearing of left eye, you need to see your eye doctor right away.     ED Prescriptions    Medication Sig Dispense Auth. Provider   erythromycin ophthalmic ointment Place a 1/2 inch ribbon of ointment into the L lower eyelid tid x 7 days. 3.5 g Rodriguez-Southworth, Sunday Spillers, PA-C     PDMP not reviewed this encounter.   Shelby Mattocks, PA-C 10/19/20 1145

## 2020-10-19 NOTE — ED Triage Notes (Signed)
Pt c/o irritation, clear drainage from left eye for approx 3 days. Denies trauma/injury to area. Reports eye is improving in symptoms. Mild erythema noted to upper and lower lids.

## 2020-10-19 NOTE — Discharge Instructions (Signed)
If your eye get more red around the color of your eye, light sensitivity and tearing of left eye, you need to see your eye doctor right away.

## 2020-11-09 DIAGNOSIS — L239 Allergic contact dermatitis, unspecified cause: Secondary | ICD-10-CM | POA: Diagnosis not present

## 2021-01-03 DIAGNOSIS — E034 Atrophy of thyroid (acquired): Secondary | ICD-10-CM | POA: Diagnosis not present

## 2021-01-10 DIAGNOSIS — E7800 Pure hypercholesterolemia, unspecified: Secondary | ICD-10-CM | POA: Diagnosis not present

## 2021-01-10 DIAGNOSIS — M810 Age-related osteoporosis without current pathological fracture: Secondary | ICD-10-CM | POA: Diagnosis not present

## 2021-01-10 DIAGNOSIS — E034 Atrophy of thyroid (acquired): Secondary | ICD-10-CM | POA: Diagnosis not present

## 2021-01-10 DIAGNOSIS — Z9989 Dependence on other enabling machines and devices: Secondary | ICD-10-CM | POA: Diagnosis not present

## 2021-01-10 DIAGNOSIS — Z0000 Encounter for general adult medical examination without abnormal findings: Secondary | ICD-10-CM | POA: Diagnosis not present

## 2021-01-10 DIAGNOSIS — G4733 Obstructive sleep apnea (adult) (pediatric): Secondary | ICD-10-CM | POA: Diagnosis not present

## 2021-01-18 DIAGNOSIS — M7062 Trochanteric bursitis, left hip: Secondary | ICD-10-CM | POA: Diagnosis not present

## 2021-01-18 DIAGNOSIS — M25552 Pain in left hip: Secondary | ICD-10-CM | POA: Diagnosis not present

## 2021-01-18 DIAGNOSIS — M76892 Other specified enthesopathies of left lower limb, excluding foot: Secondary | ICD-10-CM | POA: Diagnosis not present

## 2021-01-18 DIAGNOSIS — M1612 Unilateral primary osteoarthritis, left hip: Secondary | ICD-10-CM | POA: Diagnosis not present

## 2021-01-18 DIAGNOSIS — S76012S Strain of muscle, fascia and tendon of left hip, sequela: Secondary | ICD-10-CM | POA: Diagnosis not present

## 2021-01-18 DIAGNOSIS — G8929 Other chronic pain: Secondary | ICD-10-CM | POA: Diagnosis not present

## 2021-03-03 ENCOUNTER — Encounter: Payer: Self-pay | Admitting: Emergency Medicine

## 2021-03-03 ENCOUNTER — Ambulatory Visit
Admission: EM | Admit: 2021-03-03 | Discharge: 2021-03-03 | Disposition: A | Discharge status: Home / Self Care | Payer: PPO | Attending: Internal Medicine | Admitting: Internal Medicine

## 2021-03-03 DIAGNOSIS — S50862A Insect bite (nonvenomous) of left forearm, initial encounter: Secondary | ICD-10-CM

## 2021-03-03 DIAGNOSIS — R21 Rash and other nonspecific skin eruption: Secondary | ICD-10-CM

## 2021-03-03 MED ORDER — TRIAMCINOLONE ACETONIDE 0.1 % EX CREA: 1.0000 "application " | TOPICAL_CREAM | Freq: Two times a day (BID) | CUTANEOUS | 0 refills | Status: AC

## 2021-03-03 NOTE — Discharge Instructions (Addendum)
You seem to have a local reaction to possible insect bite. It does not look infected, but if you develop pain and hotness, then it could be and please come back. For now apply the steroid ointment for 7-10 days..  Also apply ice on area for 15 minutes 2-3 times a day.

## 2021-03-03 NOTE — ED Provider Notes (Signed)
Roderic Palau    CSN: [id number] Arrival date & time: 03/03/21  1435      History   Chief Complaint Chief Complaint  Patient presents with   Insect Bite    LFA    HPI Joanne Nash is a 80 y.o. female who presents with redness on her L forearm x 3 days. Could have been from insect bite. 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 Denies pain of this area. She has been applying antibiotic ointment.     Past Medical History:  Diagnosis Date   Arthritis    "probably all over; knees, shoulders, hands" (06/17/2012)   Colon polyps    Esophageal stricture    GERD (gastroesophageal reflux disease)    Granuloma annulare    legs   History of shingles    Hyperlipemia    Hypothyroidism    Hypothyroidism    OSA on CPAP    cpap 33yrs   Osteoporosis    Varicose veins of both lower extremities     Patient Active Problem List   Diagnosis Date Noted   Spondylolisthesis of lumbar region 04/01/2015   Glenohumeral arthritis 06/18/2012    Past Surgical History:  Procedure Laterality Date   BACK SURGERY     posterior fusion lumbar spine   BREAST BIOPSY Left early 2000's   benign   CARPAL TUNNEL RELEASE  ~ 1997   Left   CHOLECYSTECTOMY  ~ 1996   Lap   COLONOSCOPY     COLONOSCOPY WITH PROPOFOL N/A 07/08/2018   Procedure: COLONOSCOPY WITH PROPOFOL;  Surgeon: Manya Silvas, MD;  Location: [facility];  Service: Endoscopy;  Laterality: N/A;   DILATION AND CURETTAGE OF UTERUS  1980's?   DILATION AND CURETTAGE OF UTERUS     ESOPHAGOGASTRODUODENOSCOPY     JOINT REPLACEMENT  13,15   Right Knee, left   KNEE ARTHROPLASTY Left 13   REVERSE SHOULDER ARTHROPLASTY  06/17/2012   Procedure: REVERSE SHOULDER ARTHROPLASTY;  Surgeon: Nita Sells, MD;  Location: [facility];  Service: Orthopedics;  Laterality: Right;   REVERSE TOTAL SHOULDER ARTHROPLASTY  06/17/2012   right   sclerotherapy vein leg     TOTAL KNEE ARTHROPLASTY  02/2012   right   TUBAL  LIGATION  1973    OB History   No obstetric history on file.      Home Medications    Prior to Admission medications   Medication Sig Start Date End Date Taking? Authorizing Provider  triamcinolone cream (KENALOG) 0.1 % Apply 1 application topically 2 (two) times daily. 03/03/21  Yes Rodriguez-Southworth, Sunday Spillers, PA-C  CALCIUM PO Take 2 tablets by mouth 2 (two) times daily.    [provider]  Cholecalciferol (VITAMIN D PO) Take 1 tablet by mouth 2 (two) times daily.    [provider]  citalopram (CELEXA) 10 MG tablet Take 10 mg by mouth daily.    [provider]  cycloSPORINE (RESTASIS) 0.05 % ophthalmic emulsion Place 1 drop into both eyes 2 (two) times daily.    [provider]  gabapentin (NEURONTIN) 800 MG tablet Take 800 mg by mouth 3 (three) times daily.    [provider]  levothyroxine (SYNTHROID, LEVOTHROID) 88 MCG tablet Take 88 mcg by mouth daily.    [provider]  meloxicam (MOBIC) 7.5 MG tablet Take 7.5 mg by mouth 2 (two) times daily.    [provider]  Multiple Vitamin (MULTIVITAMIN WITH MINERALS) TABS Take 1 tablet  by mouth daily.    [provider]  Multiple Vitamins-Minerals (PRESERVISION AREDS 2 PO) Take 1 capsule by mouth 2 (two) times daily.    [provider]  omeprazole (PRILOSEC) 20 MG capsule Take 20 mg by mouth daily.    [provider]  oxybutynin (DITROPAN) 5 MG tablet Take 5 mg by mouth 3 (three) times daily.    [provider]  simvastatin (ZOCOR) 20 MG tablet Take 20 mg by mouth every evening.    [provider]    Family History History reviewed. No pertinent family history.  Social History Social History   Tobacco Use   Smoking status: Former    Packs/day: 1.50    Years: 25.00    Pack years: 37.50    Types: Cigarettes    Quit date: 09/18/1980    Years since quitting: 40.4   Smokeless tobacco: Never  Vaping Use   Vaping Use: Never  used  Substance Use Topics   Alcohol use: No   Drug use: No     Allergies   Patient has no known allergies.   Review of Systems Review of Systems  Skin:  Positive for color change. Negative for rash and wound.       + pruritus    Physical Exam Triage Vital Signs ED Triage Vitals  Enc Vitals Group     BP 03/03/21 1439 122/74     Pulse Rate 03/03/21 1439 67     Resp 03/03/21 1439 18     Temp 03/03/21 1439 (!) 97.4 F (36.3 C)     Temp Source 03/03/21 1439 Oral     SpO2 03/03/21 1439 95 %     Weight --      Height --      Head Circumference --      Peak Flow --      Pain Score 03/03/21 1440 0     Pain Loc --      Pain Edu? --      Excl. in [place]? --    No data found.  Updated Vital Signs BP 122/74 (BP Location: Left Arm)   Pulse 67   Temp (!) 97.4 F (36.3 C) (Oral)   Resp 18   SpO2 95%   Visual Acuity Right Eye Distance:   Left Eye Distance:   Bilateral Distance:    Right Eye Near:   Left Eye Near:    Bilateral Near:     Physical Exam Vitals and nursing note reviewed.  Constitutional:      General: She is not in acute distress.    Appearance: She is not toxic-appearing.  HENT:     Head: Normocephalic.     Right Ear: External ear normal.     Left Ear: External ear normal.  Eyes:     General: No scleral icterus.    Conjunctiva/sclera: Conjunctivae normal.  Pulmonary:     Effort: Pulmonary effort is normal.  Musculoskeletal:        General: Normal range of motion.     Cervical back: Neck supple.  Skin:    General: Skin is warm and dry.     Comments: L FOREARM- has 1/2 cm x 1/2 cm slightly erythematous macule with a pin hole in the center with mild erythema around it, but not hot or painful.   Neurological:     Mental Status: She is alert and oriented to person, place, and time.     Gait: Gait normal.  Comments: Uses a cane to walk  Psychiatric:        Mood and Affect: Mood normal.        Behavior: Behavior normal.        Thought Content:  Thought content normal.        Judgment: Judgment normal.     UC Treatments / Results  Labs (all labs ordered are listed, but only abnormal results are displayed) Labs Reviewed - No data to display  EKG   Radiology No results found.  Procedures Procedures (including critical care time)  Medications Ordered in UC Medications - No data to display  Initial Impression / Assessment and Plan / UC Course  I have reviewed the triage vital signs and the nursing notes. Seems to have local reaction on L forearm to possibly insect bite. I advised her to apply ice and I prescribed her Triamcinolone cream as noted, but to wait til it has absorbed into the skin before using ice. If she develops pain or area gets hot, needs to be seen again.      Final Clinical Impressions(s) / UC Diagnoses   Final diagnoses:  None     Discharge Instructions      You seem to have a local reaction to possible insect bite. It does not look infected, but if you develop pain and hotness, then it could be and please come back. For now apply the steroid ointment for 7-10 days..  Also apply ice on area for 15 minutes 2-3 times a day.      ED Prescriptions     Medication Sig Dispense Auth. Provider   triamcinolone cream (KENALOG) 0.1 % Apply 1 application topically 2 (two) times daily. 30 g Rodriguez-Southworth, Sunday Spillers, PA-C      PDMP not reviewed this encounter.   Shelby Mattocks, PA-C 03/03/21 1501

## 2021-03-03 NOTE — ED Triage Notes (Signed)
Pt presents today with c/o possible insect bite to left forearm x 3 days.

## 2021-04-13 DIAGNOSIS — H353132 Nonexudative age-related macular degeneration, bilateral, intermediate dry stage: Secondary | ICD-10-CM | POA: Diagnosis not present

## 2021-05-10 DIAGNOSIS — Z981 Arthrodesis status: Secondary | ICD-10-CM | POA: Diagnosis not present

## 2021-06-14 DIAGNOSIS — M76892 Other specified enthesopathies of left lower limb, excluding foot: Secondary | ICD-10-CM | POA: Diagnosis not present

## 2021-06-14 DIAGNOSIS — M7062 Trochanteric bursitis, left hip: Secondary | ICD-10-CM | POA: Diagnosis not present

## 2021-06-14 DIAGNOSIS — M1612 Unilateral primary osteoarthritis, left hip: Secondary | ICD-10-CM | POA: Diagnosis not present

## 2021-06-14 DIAGNOSIS — G8929 Other chronic pain: Secondary | ICD-10-CM | POA: Diagnosis not present

## 2021-06-14 DIAGNOSIS — S76012S Strain of muscle, fascia and tendon of left hip, sequela: Secondary | ICD-10-CM | POA: Diagnosis not present

## 2021-06-14 DIAGNOSIS — M25552 Pain in left hip: Secondary | ICD-10-CM | POA: Diagnosis not present

## 2021-07-14 DIAGNOSIS — E7800 Pure hypercholesterolemia, unspecified: Secondary | ICD-10-CM | POA: Diagnosis not present

## 2021-07-14 DIAGNOSIS — E034 Atrophy of thyroid (acquired): Secondary | ICD-10-CM | POA: Diagnosis not present

## 2021-07-21 ENCOUNTER — Other Ambulatory Visit: Payer: Self-pay | Admitting: Internal Medicine

## 2021-07-21 DIAGNOSIS — Z23 Encounter for immunization: Secondary | ICD-10-CM | POA: Diagnosis not present

## 2021-07-21 DIAGNOSIS — E7800 Pure hypercholesterolemia, unspecified: Secondary | ICD-10-CM | POA: Diagnosis not present

## 2021-07-21 DIAGNOSIS — Z0001 Encounter for general adult medical examination with abnormal findings: Secondary | ICD-10-CM | POA: Diagnosis not present

## 2021-07-21 DIAGNOSIS — M1712 Unilateral primary osteoarthritis, left knee: Secondary | ICD-10-CM | POA: Diagnosis not present

## 2021-07-21 DIAGNOSIS — Z1231 Encounter for screening mammogram for malignant neoplasm of breast: Secondary | ICD-10-CM

## 2021-07-21 DIAGNOSIS — E034 Atrophy of thyroid (acquired): Secondary | ICD-10-CM | POA: Diagnosis not present

## 2021-07-21 DIAGNOSIS — D696 Thrombocytopenia, unspecified: Secondary | ICD-10-CM | POA: Diagnosis not present

## 2021-07-21 DIAGNOSIS — G4733 Obstructive sleep apnea (adult) (pediatric): Secondary | ICD-10-CM | POA: Diagnosis not present

## 2021-07-21 DIAGNOSIS — Z9989 Dependence on other enabling machines and devices: Secondary | ICD-10-CM | POA: Diagnosis not present

## 2021-08-30 DIAGNOSIS — Z85828 Personal history of other malignant neoplasm of skin: Secondary | ICD-10-CM | POA: Diagnosis not present

## 2021-08-30 DIAGNOSIS — D2271 Melanocytic nevi of right lower limb, including hip: Secondary | ICD-10-CM | POA: Diagnosis not present

## 2021-08-30 DIAGNOSIS — D2262 Melanocytic nevi of left upper limb, including shoulder: Secondary | ICD-10-CM | POA: Diagnosis not present

## 2021-08-30 DIAGNOSIS — D2261 Melanocytic nevi of right upper limb, including shoulder: Secondary | ICD-10-CM | POA: Diagnosis not present

## 2021-08-30 DIAGNOSIS — D2272 Melanocytic nevi of left lower limb, including hip: Secondary | ICD-10-CM | POA: Diagnosis not present

## 2021-09-23 ENCOUNTER — Other Ambulatory Visit: Payer: Self-pay

## 2021-09-23 ENCOUNTER — Ambulatory Visit
Admission: RE | Admit: 2021-09-23 | Discharge: 2021-09-23 | Disposition: A | Discharge status: Home / Self Care | Payer: PPO | Source: Ambulatory Visit | Attending: Internal Medicine | Admitting: Internal Medicine

## 2021-09-23 DIAGNOSIS — Z1231 Encounter for screening mammogram for malignant neoplasm of breast: Secondary | ICD-10-CM | POA: Insufficient documentation

## 2021-10-05 DIAGNOSIS — H353132 Nonexudative age-related macular degeneration, bilateral, intermediate dry stage: Secondary | ICD-10-CM | POA: Diagnosis not present

## 2021-11-01 DIAGNOSIS — G8929 Other chronic pain: Secondary | ICD-10-CM | POA: Diagnosis not present

## 2021-11-01 DIAGNOSIS — M7062 Trochanteric bursitis, left hip: Secondary | ICD-10-CM | POA: Diagnosis not present

## 2021-11-01 DIAGNOSIS — S76012D Strain of muscle, fascia and tendon of left hip, subsequent encounter: Secondary | ICD-10-CM | POA: Diagnosis not present

## 2021-11-01 DIAGNOSIS — M76892 Other specified enthesopathies of left lower limb, excluding foot: Secondary | ICD-10-CM | POA: Diagnosis not present

## 2021-11-01 DIAGNOSIS — M1612 Unilateral primary osteoarthritis, left hip: Secondary | ICD-10-CM | POA: Diagnosis not present

## 2021-11-01 DIAGNOSIS — M25552 Pain in left hip: Secondary | ICD-10-CM | POA: Diagnosis not present

## 2022-01-25 DIAGNOSIS — E034 Atrophy of thyroid (acquired): Secondary | ICD-10-CM | POA: Diagnosis not present

## 2022-02-01 DIAGNOSIS — Z9989 Dependence on other enabling machines and devices: Secondary | ICD-10-CM | POA: Diagnosis not present

## 2022-02-01 DIAGNOSIS — E034 Atrophy of thyroid (acquired): Secondary | ICD-10-CM | POA: Diagnosis not present

## 2022-02-01 DIAGNOSIS — E7800 Pure hypercholesterolemia, unspecified: Secondary | ICD-10-CM | POA: Diagnosis not present

## 2022-02-01 DIAGNOSIS — M810 Age-related osteoporosis without current pathological fracture: Secondary | ICD-10-CM | POA: Diagnosis not present

## 2022-02-01 DIAGNOSIS — G4733 Obstructive sleep apnea (adult) (pediatric): Secondary | ICD-10-CM | POA: Diagnosis not present

## 2022-02-01 DIAGNOSIS — D696 Thrombocytopenia, unspecified: Secondary | ICD-10-CM | POA: Diagnosis not present

## 2022-03-02 DIAGNOSIS — S76012S Strain of muscle, fascia and tendon of left hip, sequela: Secondary | ICD-10-CM | POA: Diagnosis not present

## 2022-03-02 DIAGNOSIS — M7062 Trochanteric bursitis, left hip: Secondary | ICD-10-CM | POA: Diagnosis not present

## 2022-03-02 DIAGNOSIS — M1612 Unilateral primary osteoarthritis, left hip: Secondary | ICD-10-CM | POA: Diagnosis not present

## 2022-03-02 DIAGNOSIS — G8929 Other chronic pain: Secondary | ICD-10-CM | POA: Diagnosis not present

## 2022-03-02 DIAGNOSIS — M25552 Pain in left hip: Secondary | ICD-10-CM | POA: Diagnosis not present

## 2022-04-04 DIAGNOSIS — K5900 Constipation, unspecified: Secondary | ICD-10-CM | POA: Diagnosis not present

## 2022-04-04 DIAGNOSIS — R197 Diarrhea, unspecified: Secondary | ICD-10-CM | POA: Diagnosis not present

## 2022-04-25 DIAGNOSIS — H353132 Nonexudative age-related macular degeneration, bilateral, intermediate dry stage: Secondary | ICD-10-CM | POA: Diagnosis not present

## 2022-05-30 DIAGNOSIS — M7062 Trochanteric bursitis, left hip: Secondary | ICD-10-CM | POA: Diagnosis not present

## 2022-05-30 DIAGNOSIS — M4316 Spondylolisthesis, lumbar region: Secondary | ICD-10-CM | POA: Diagnosis not present

## 2022-05-30 DIAGNOSIS — Z981 Arthrodesis status: Secondary | ICD-10-CM | POA: Diagnosis not present

## 2022-05-30 DIAGNOSIS — Z6830 Body mass index (BMI) 30.0-30.9, adult: Secondary | ICD-10-CM | POA: Diagnosis not present

## 2022-06-21 DIAGNOSIS — M1612 Unilateral primary osteoarthritis, left hip: Secondary | ICD-10-CM | POA: Diagnosis not present

## 2022-06-21 DIAGNOSIS — G8929 Other chronic pain: Secondary | ICD-10-CM | POA: Diagnosis not present

## 2022-06-21 DIAGNOSIS — S76012S Strain of muscle, fascia and tendon of left hip, sequela: Secondary | ICD-10-CM | POA: Diagnosis not present

## 2022-06-21 DIAGNOSIS — M76892 Other specified enthesopathies of left lower limb, excluding foot: Secondary | ICD-10-CM | POA: Diagnosis not present

## 2022-06-21 DIAGNOSIS — M7062 Trochanteric bursitis, left hip: Secondary | ICD-10-CM | POA: Diagnosis not present

## 2022-07-27 DIAGNOSIS — E034 Atrophy of thyroid (acquired): Secondary | ICD-10-CM | POA: Diagnosis not present

## 2022-07-27 DIAGNOSIS — E7800 Pure hypercholesterolemia, unspecified: Secondary | ICD-10-CM | POA: Diagnosis not present

## 2022-08-01 DIAGNOSIS — E7800 Pure hypercholesterolemia, unspecified: Secondary | ICD-10-CM | POA: Diagnosis not present

## 2022-08-01 DIAGNOSIS — Z0000 Encounter for general adult medical examination without abnormal findings: Secondary | ICD-10-CM | POA: Diagnosis not present

## 2022-08-01 DIAGNOSIS — E034 Atrophy of thyroid (acquired): Secondary | ICD-10-CM | POA: Diagnosis not present

## 2022-08-01 DIAGNOSIS — R197 Diarrhea, unspecified: Secondary | ICD-10-CM | POA: Diagnosis not present

## 2022-08-01 DIAGNOSIS — M810 Age-related osteoporosis without current pathological fracture: Secondary | ICD-10-CM | POA: Diagnosis not present

## 2022-08-01 DIAGNOSIS — G4733 Obstructive sleep apnea (adult) (pediatric): Secondary | ICD-10-CM | POA: Diagnosis not present

## 2022-08-23 ENCOUNTER — Other Ambulatory Visit: Payer: Self-pay | Admitting: Internal Medicine

## 2022-08-23 DIAGNOSIS — Z1231 Encounter for screening mammogram for malignant neoplasm of breast: Secondary | ICD-10-CM

## 2022-09-05 DIAGNOSIS — D2272 Melanocytic nevi of left lower limb, including hip: Secondary | ICD-10-CM | POA: Diagnosis not present

## 2022-09-05 DIAGNOSIS — L821 Other seborrheic keratosis: Secondary | ICD-10-CM | POA: Diagnosis not present

## 2022-09-05 DIAGNOSIS — D2261 Melanocytic nevi of right upper limb, including shoulder: Secondary | ICD-10-CM | POA: Diagnosis not present

## 2022-09-05 DIAGNOSIS — D2262 Melanocytic nevi of left upper limb, including shoulder: Secondary | ICD-10-CM | POA: Diagnosis not present

## 2022-09-05 DIAGNOSIS — D2271 Melanocytic nevi of right lower limb, including hip: Secondary | ICD-10-CM | POA: Diagnosis not present

## 2022-09-05 DIAGNOSIS — Z08 Encounter for follow-up examination after completed treatment for malignant neoplasm: Secondary | ICD-10-CM | POA: Diagnosis not present

## 2022-09-05 DIAGNOSIS — Z85828 Personal history of other malignant neoplasm of skin: Secondary | ICD-10-CM | POA: Diagnosis not present

## 2022-09-25 ENCOUNTER — Ambulatory Visit
Admission: RE | Admit: 2022-09-25 | Discharge: 2022-09-25 | Disposition: A | Discharge status: Home / Self Care | Payer: PPO | Source: Ambulatory Visit | Attending: Internal Medicine | Admitting: Internal Medicine

## 2022-09-25 DIAGNOSIS — Z1231 Encounter for screening mammogram for malignant neoplasm of breast: Secondary | ICD-10-CM | POA: Insufficient documentation

## 2022-10-20 DIAGNOSIS — M76892 Other specified enthesopathies of left lower limb, excluding foot: Secondary | ICD-10-CM | POA: Diagnosis not present

## 2022-10-20 DIAGNOSIS — G8929 Other chronic pain: Secondary | ICD-10-CM | POA: Diagnosis not present

## 2022-10-20 DIAGNOSIS — S76012D Strain of muscle, fascia and tendon of left hip, subsequent encounter: Secondary | ICD-10-CM | POA: Diagnosis not present

## 2022-10-20 DIAGNOSIS — M1612 Unilateral primary osteoarthritis, left hip: Secondary | ICD-10-CM | POA: Diagnosis not present

## 2022-10-20 DIAGNOSIS — M7062 Trochanteric bursitis, left hip: Secondary | ICD-10-CM | POA: Diagnosis not present

## 2022-11-06 DIAGNOSIS — H353132 Nonexudative age-related macular degeneration, bilateral, intermediate dry stage: Secondary | ICD-10-CM | POA: Diagnosis not present

## 2022-11-06 DIAGNOSIS — H2513 Age-related nuclear cataract, bilateral: Secondary | ICD-10-CM | POA: Diagnosis not present

## 2023-01-10 DIAGNOSIS — K5901 Slow transit constipation: Secondary | ICD-10-CM | POA: Diagnosis not present

## 2023-01-10 DIAGNOSIS — Z86010 Personal history of colonic polyps: Secondary | ICD-10-CM | POA: Diagnosis not present

## 2023-01-30 DIAGNOSIS — E034 Atrophy of thyroid (acquired): Secondary | ICD-10-CM | POA: Diagnosis not present

## 2023-02-06 DIAGNOSIS — D696 Thrombocytopenia, unspecified: Secondary | ICD-10-CM | POA: Diagnosis not present

## 2023-02-06 DIAGNOSIS — M810 Age-related osteoporosis without current pathological fracture: Secondary | ICD-10-CM | POA: Diagnosis not present

## 2023-02-06 DIAGNOSIS — E7800 Pure hypercholesterolemia, unspecified: Secondary | ICD-10-CM | POA: Diagnosis not present

## 2023-02-06 DIAGNOSIS — E034 Atrophy of thyroid (acquired): Secondary | ICD-10-CM | POA: Diagnosis not present

## 2023-02-06 DIAGNOSIS — Z0001 Encounter for general adult medical examination with abnormal findings: Secondary | ICD-10-CM | POA: Diagnosis not present

## 2023-02-06 DIAGNOSIS — G4733 Obstructive sleep apnea (adult) (pediatric): Secondary | ICD-10-CM | POA: Diagnosis not present

## 2023-02-06 DIAGNOSIS — Z0000 Encounter for general adult medical examination without abnormal findings: Secondary | ICD-10-CM | POA: Diagnosis not present

## 2023-02-14 DIAGNOSIS — S76012D Strain of muscle, fascia and tendon of left hip, subsequent encounter: Secondary | ICD-10-CM | POA: Diagnosis not present

## 2023-02-14 DIAGNOSIS — M7062 Trochanteric bursitis, left hip: Secondary | ICD-10-CM | POA: Diagnosis not present

## 2023-02-14 DIAGNOSIS — M1612 Unilateral primary osteoarthritis, left hip: Secondary | ICD-10-CM | POA: Diagnosis not present

## 2023-02-14 DIAGNOSIS — M76892 Other specified enthesopathies of left lower limb, excluding foot: Secondary | ICD-10-CM | POA: Diagnosis not present

## 2023-02-14 DIAGNOSIS — G8929 Other chronic pain: Secondary | ICD-10-CM | POA: Diagnosis not present

## 2023-05-06 IMAGING — MG MM DIGITAL SCREENING BILAT W/ TOMO AND CAD
8 series · 8 of 24 positions shown · non-contrast
Comparison: Previous exam(s).

CLINICAL DATA: Screening.

EXAM:
DIGITAL SCREENING BILATERAL MAMMOGRAM WITH TOMOSYNTHESIS AND CAD
TECHNIQUE: Bilateral screening digital craniocaudal and mediolateral oblique
mammograms were obtained. Bilateral screening digital breast
tomosynthesis was performed. The images were evaluated with
computer-aided detection.

[R CC synth-2D]
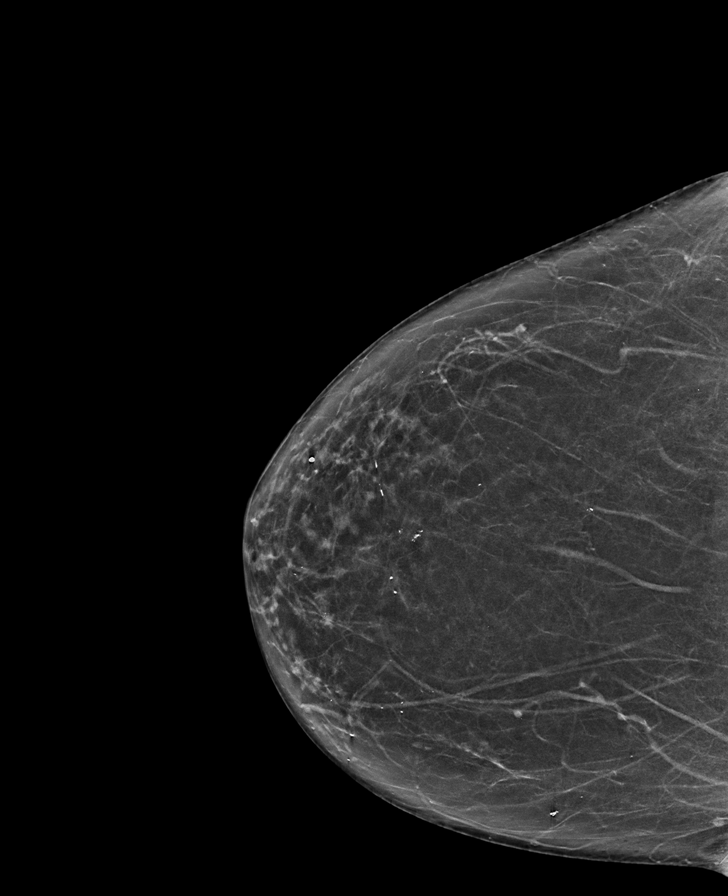

[L MLO synth-2D]
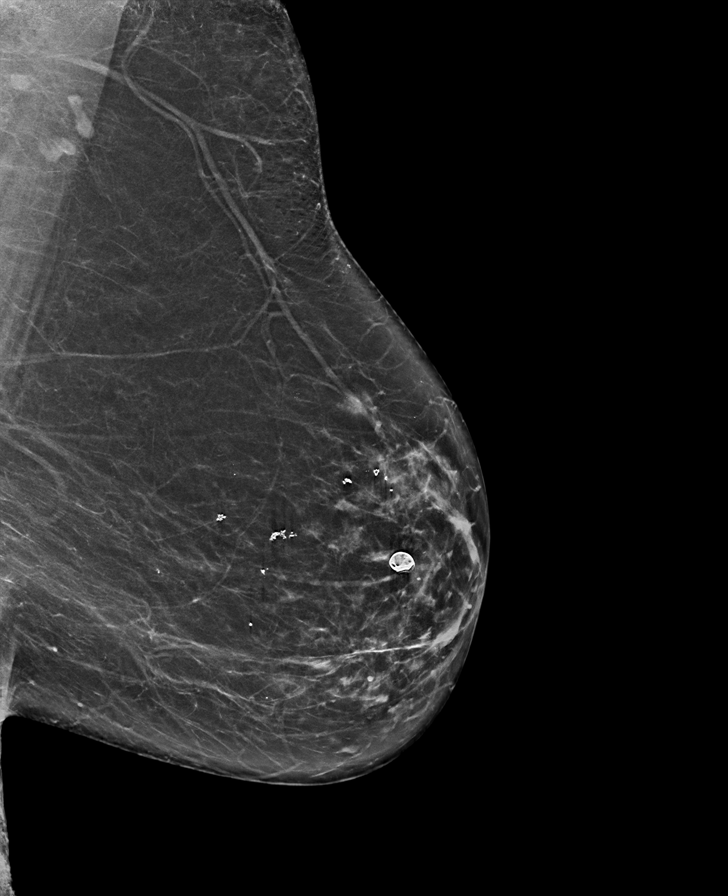

[L CC synth-2D]
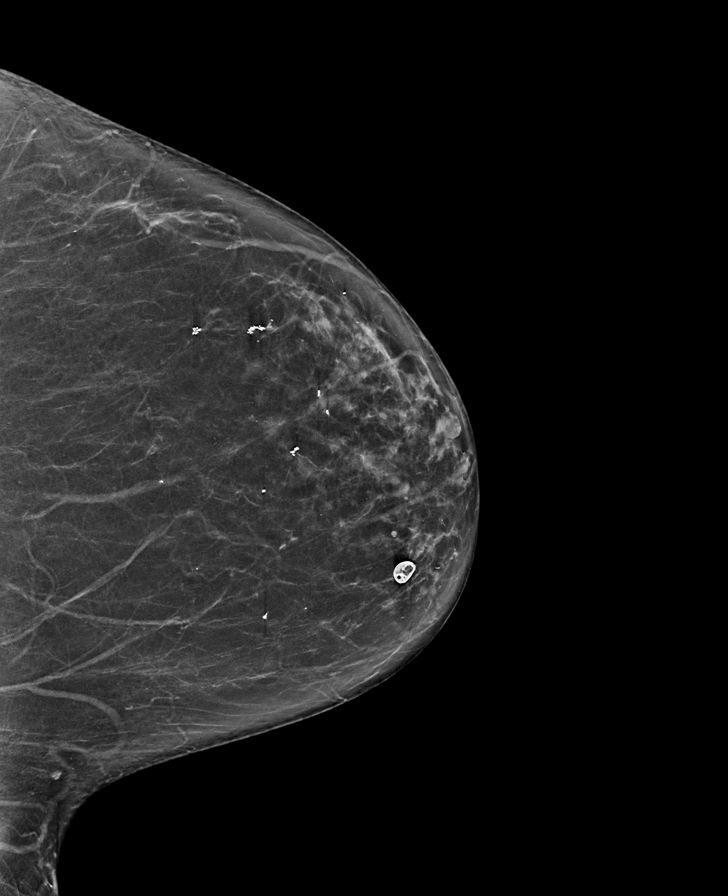

[R MLO synth-2D]
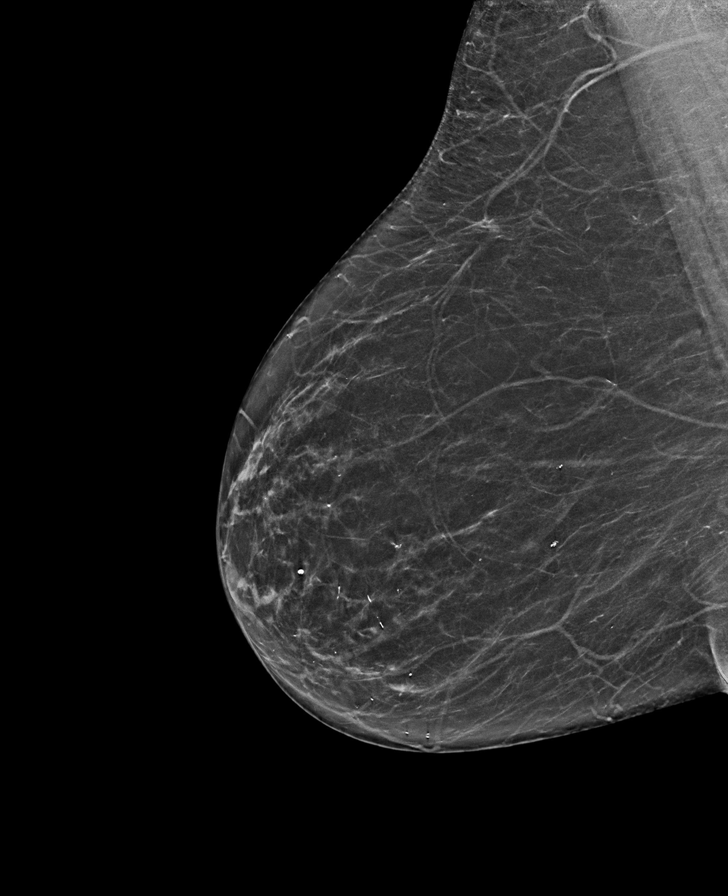

[R CC tomo · tomo slice 33/66.0]
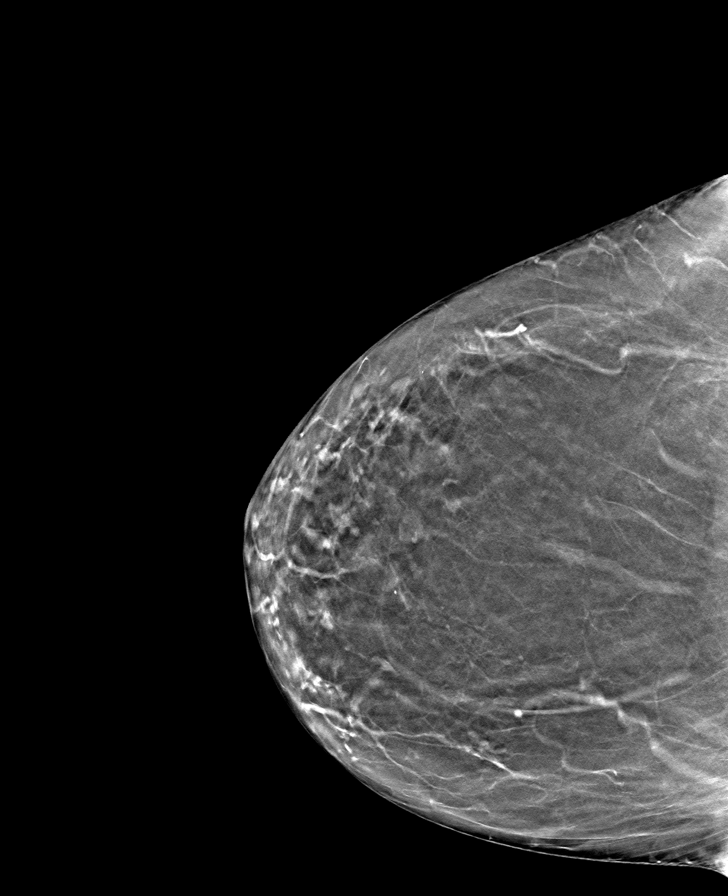

[R MLO tomo · tomo slice 34/67.0]
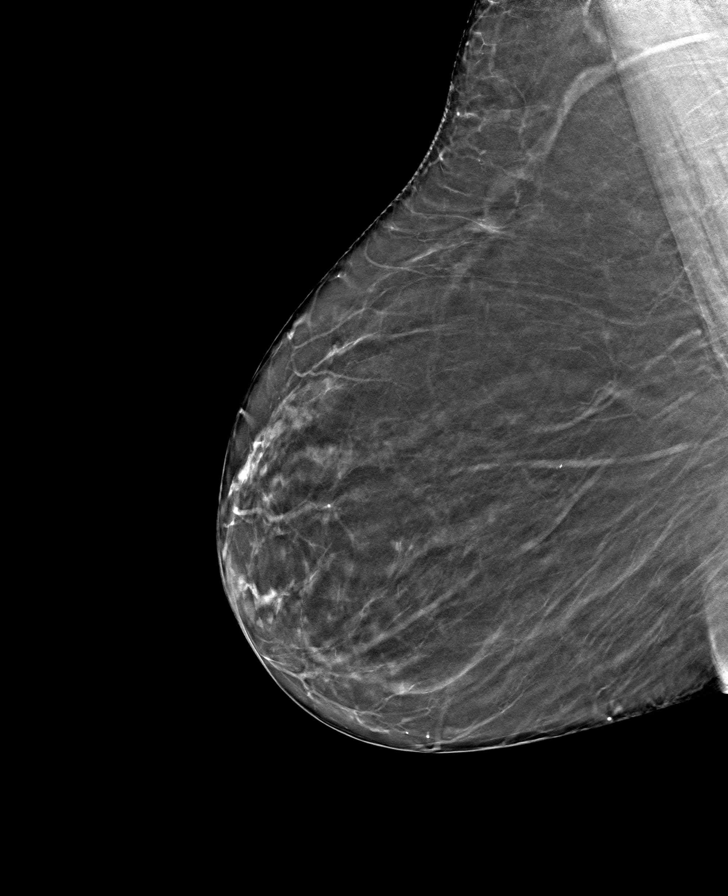

[L CC tomo · tomo slice 36/71.0]
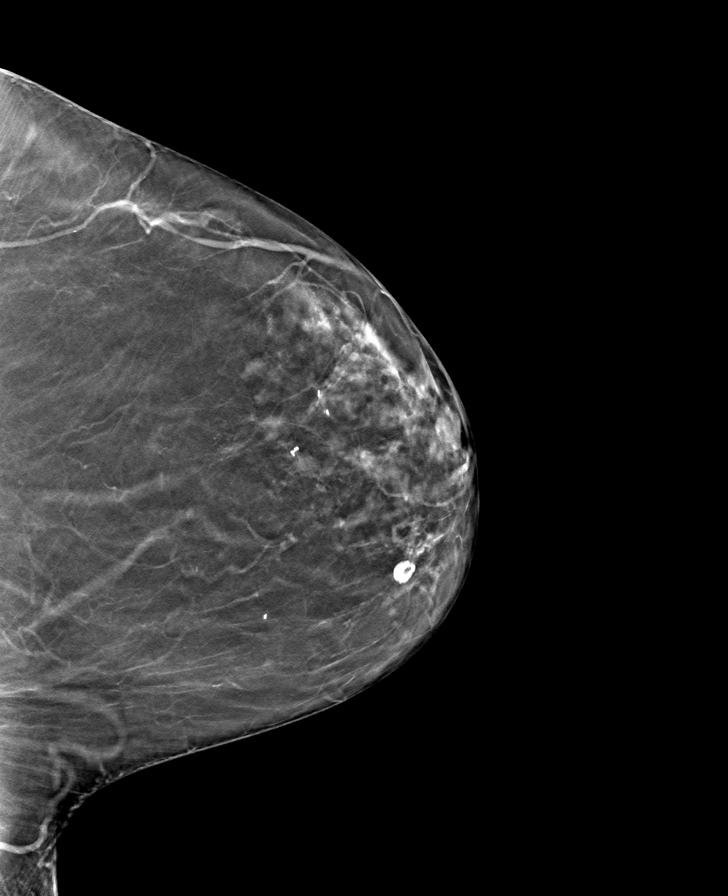

[L MLO tomo · tomo slice 39/76.0]
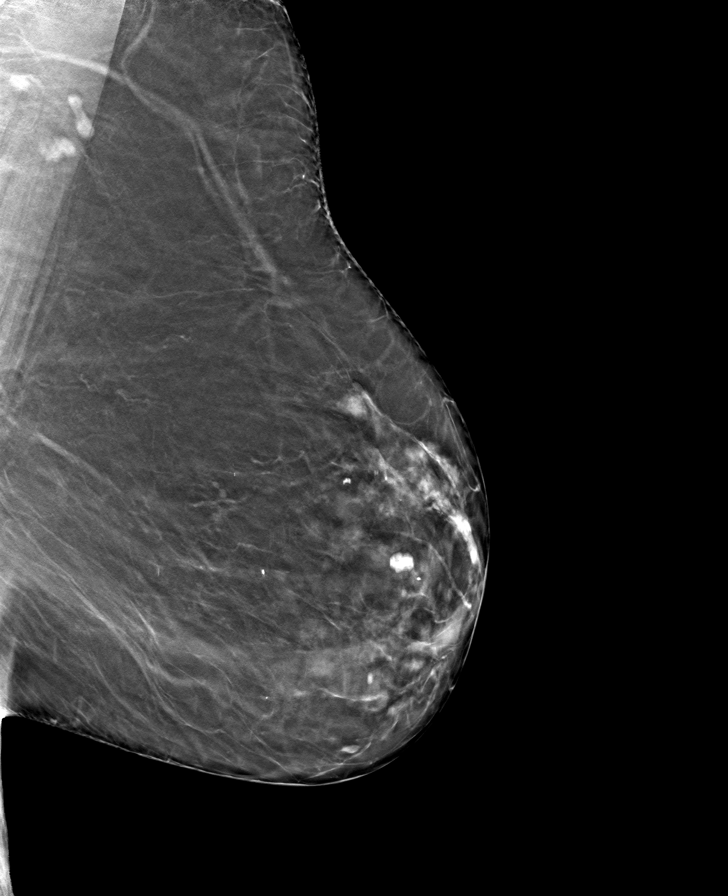

[8 of 24 positions shown; findings below may reference images not displayed]

ACR Breast Density Category b: There are scattered areas of
fibroglandular density.
FINDINGS: There are no findings suspicious for malignancy.
IMPRESSION: No mammographic evidence of malignancy. A result letter of this
screening mammogram will be mailed directly to the patient.

RECOMMENDATION:
Screening mammogram in one year. (Code:51-O-LD2)

BI-RADS CATEGORY  1: Negative.

## 2023-05-07 DIAGNOSIS — H2513 Age-related nuclear cataract, bilateral: Secondary | ICD-10-CM | POA: Diagnosis not present

## 2023-05-07 DIAGNOSIS — H353132 Nonexudative age-related macular degeneration, bilateral, intermediate dry stage: Secondary | ICD-10-CM | POA: Diagnosis not present

## 2023-06-13 DIAGNOSIS — G8929 Other chronic pain: Secondary | ICD-10-CM | POA: Diagnosis not present

## 2023-06-13 DIAGNOSIS — M76892 Other specified enthesopathies of left lower limb, excluding foot: Secondary | ICD-10-CM | POA: Diagnosis not present

## 2023-06-13 DIAGNOSIS — M7062 Trochanteric bursitis, left hip: Secondary | ICD-10-CM | POA: Diagnosis not present

## 2023-06-13 DIAGNOSIS — S76012D Strain of muscle, fascia and tendon of left hip, subsequent encounter: Secondary | ICD-10-CM | POA: Diagnosis not present

## 2023-06-13 DIAGNOSIS — M1612 Unilateral primary osteoarthritis, left hip: Secondary | ICD-10-CM | POA: Diagnosis not present

## 2023-06-19 DIAGNOSIS — M4316 Spondylolisthesis, lumbar region: Secondary | ICD-10-CM | POA: Diagnosis not present

## 2023-07-31 DIAGNOSIS — E034 Atrophy of thyroid (acquired): Secondary | ICD-10-CM | POA: Diagnosis not present

## 2023-08-07 DIAGNOSIS — Z23 Encounter for immunization: Secondary | ICD-10-CM | POA: Diagnosis not present

## 2023-08-07 DIAGNOSIS — M810 Age-related osteoporosis without current pathological fracture: Secondary | ICD-10-CM | POA: Diagnosis not present

## 2023-08-07 DIAGNOSIS — G4733 Obstructive sleep apnea (adult) (pediatric): Secondary | ICD-10-CM | POA: Diagnosis not present

## 2023-08-07 DIAGNOSIS — E7800 Pure hypercholesterolemia, unspecified: Secondary | ICD-10-CM | POA: Diagnosis not present

## 2023-08-07 DIAGNOSIS — E034 Atrophy of thyroid (acquired): Secondary | ICD-10-CM | POA: Diagnosis not present

## 2023-08-07 DIAGNOSIS — D696 Thrombocytopenia, unspecified: Secondary | ICD-10-CM | POA: Diagnosis not present

## 2023-08-29 ENCOUNTER — Other Ambulatory Visit: Payer: Self-pay | Admitting: Internal Medicine

## 2023-08-29 DIAGNOSIS — Z1231 Encounter for screening mammogram for malignant neoplasm of breast: Secondary | ICD-10-CM

## 2023-09-06 DIAGNOSIS — D2262 Melanocytic nevi of left upper limb, including shoulder: Secondary | ICD-10-CM | POA: Diagnosis not present

## 2023-09-06 DIAGNOSIS — D225 Melanocytic nevi of trunk: Secondary | ICD-10-CM | POA: Diagnosis not present

## 2023-09-06 DIAGNOSIS — L821 Other seborrheic keratosis: Secondary | ICD-10-CM | POA: Diagnosis not present

## 2023-09-06 DIAGNOSIS — Z85828 Personal history of other malignant neoplasm of skin: Secondary | ICD-10-CM | POA: Diagnosis not present

## 2023-09-06 DIAGNOSIS — D2261 Melanocytic nevi of right upper limb, including shoulder: Secondary | ICD-10-CM | POA: Diagnosis not present

## 2023-09-06 DIAGNOSIS — L720 Epidermal cyst: Secondary | ICD-10-CM | POA: Diagnosis not present

## 2023-10-01 ENCOUNTER — Ambulatory Visit
Admission: RE | Admit: 2023-10-01 | Discharge: 2023-10-01 | Disposition: A | Discharge status: Home / Self Care | Payer: PPO | Source: Ambulatory Visit | Attending: Internal Medicine | Admitting: Internal Medicine

## 2023-10-01 DIAGNOSIS — Z1231 Encounter for screening mammogram for malignant neoplasm of breast: Secondary | ICD-10-CM | POA: Diagnosis not present

## 2023-10-23 ENCOUNTER — Ambulatory Visit: Payer: PPO | Admitting: Orthopedic Surgery

## 2023-10-30 ENCOUNTER — Ambulatory Visit: Payer: PPO | Admitting: Podiatry

## 2023-10-30 ENCOUNTER — Encounter: Payer: Self-pay | Admitting: Podiatry

## 2023-10-30 VITALS — Ht 62.0 in | Wt 182.0 lb

## 2023-10-30 DIAGNOSIS — L84 Corns and callosities: Secondary | ICD-10-CM

## 2023-10-30 NOTE — Progress Notes (Signed)
Subjective:  Patient ID: Joanne Nash, female    DOB: [birth year],  MRN: [id number]  Chief Complaint  Patient presents with   Callouses    Pt is here due to callous in between her great and 2nd toe on the left foot.    83 y.o. female presents with the above complaint.  Patient presents with complaint of left first and second digit heloma molle painful to touch is progressive gotten worse worse with ambulation worse with pressure she has not seen and was prior to seeing me.  She states that she has tried offloading and protecting with a form 2 but none of which has helped.  She wanted discuss other treatment options pain scale 7 out of 10 dull aching nature hurts while ambulating   Review of Systems: Negative except as noted in the HPI. Denies N/V/F/Ch.  Past Medical History:  Diagnosis Date   Arthritis    "probably all over; knees, shoulders, hands" (06/17/2012)   Colon polyps    Esophageal stricture    GERD (gastroesophageal reflux disease)    Granuloma annulare    legs   History of shingles    Hyperlipemia    Hypothyroidism    Hypothyroidism    OSA on CPAP    cpap 1yrs   Osteoporosis    Varicose veins of both lower extremities     Current Outpatient Medications:    CALCIUM PO, Take 2 tablets by mouth 2 (two) times daily., Disp: , Rfl:    Cholecalciferol (VITAMIN D PO), Take 1 tablet by mouth 2 (two) times daily., Disp: , Rfl:    citalopram (CELEXA) 10 MG tablet, Take 10 mg by mouth daily., Disp: , Rfl:    cycloSPORINE (RESTASIS) 0.05 % ophthalmic emulsion, Place 1 drop into both eyes 2 (two) times daily., Disp: , Rfl:    gabapentin (NEURONTIN) 800 MG tablet, Take 800 mg by mouth 3 (three) times daily., Disp: , Rfl:    levothyroxine (SYNTHROID, LEVOTHROID) 88 MCG tablet, Take 88 mcg by mouth daily., Disp: , Rfl:    meloxicam (MOBIC) 7.5 MG tablet, Take 7.5 mg by mouth 2 (two) times daily., Disp: , Rfl:    Multiple Vitamin (MULTIVITAMIN WITH MINERALS) TABS, Take 1 tablet by  mouth daily., Disp: , Rfl:    Multiple Vitamins-Minerals (PRESERVISION AREDS 2 PO), Take 1 capsule by mouth 2 (two) times daily., Disp: , Rfl:    omeprazole (PRILOSEC) 20 MG capsule, Take 20 mg by mouth daily., Disp: , Rfl:    oxybutynin (DITROPAN) 5 MG tablet, Take 5 mg by mouth 3 (three) times daily., Disp: , Rfl:    simvastatin (ZOCOR) 20 MG tablet, Take 20 mg by mouth every evening., Disp: , Rfl:    triamcinolone cream (KENALOG) 0.1 %, Apply 1 application topically 2 (two) times daily., Disp: 30 g, Rfl: 0  Social History   Tobacco Use  Smoking Status Former   Current packs/day: 0.00   Average packs/day: 1.5 packs/day for 25.0 years (37.5 ttl pk-yrs)   Types: Cigarettes   Start date: 09/19/1955   Quit date: 09/18/1980   Years since quitting: 43.1  Smokeless Tobacco Never    No Known Allergies Objective:  There were no vitals filed for this visit. Body mass index is 33.29 kg/m. Constitutional Well developed. Well nourished.  Vascular Dorsalis pedis pulses palpable bilaterally. Posterior tibial pulses palpable bilaterally. Capillary refill normal to all digits.  No cyanosis or clubbing noted. Pedal hair growth normal.  Neurologic Normal speech. Oriented to  person, place, and time. Epicritic sensation to light touch grossly present bilaterally.  Dermatologic Left hallux and second digit heloma molle pain on palpation.  Hurts with ambulation.  Central nucleated core noted.  Orthopedic: Normal joint ROM without pain or crepitus bilaterally. No visible deformities. No bony tenderness.   Radiographs: None Assessment:   1. Heloma molle    Plan:  Patient was evaluated and treated and all questions answered.  Left first and second digit heloma molle -All questions and concerns were discussed with the patient in extensive detail -I discussed shoe gear modification extensive detail.  Patient states understand will obtain wider toebox shoes -Toe spacers were dispensed to create  spacing between the toes. -If there is no improvement patient will need surgery  No follow-ups on file.

## 2023-11-22 DIAGNOSIS — B372 Candidiasis of skin and nail: Secondary | ICD-10-CM | POA: Diagnosis not present

## 2023-12-11 DIAGNOSIS — M25552 Pain in left hip: Secondary | ICD-10-CM | POA: Diagnosis not present

## 2023-12-11 DIAGNOSIS — S76012S Strain of muscle, fascia and tendon of left hip, sequela: Secondary | ICD-10-CM | POA: Diagnosis not present

## 2023-12-11 DIAGNOSIS — M1612 Unilateral primary osteoarthritis, left hip: Secondary | ICD-10-CM | POA: Diagnosis not present

## 2023-12-11 DIAGNOSIS — G8929 Other chronic pain: Secondary | ICD-10-CM | POA: Diagnosis not present

## 2023-12-11 DIAGNOSIS — M7062 Trochanteric bursitis, left hip: Secondary | ICD-10-CM | POA: Diagnosis not present

## 2023-12-11 DIAGNOSIS — M76892 Other specified enthesopathies of left lower limb, excluding foot: Secondary | ICD-10-CM | POA: Diagnosis not present

## 2023-12-11 NOTE — Progress Notes (Signed)
 [facility] Chief Complaint:   Chief Complaint  Patient presents with  . Left Hip - Follow-up, Pain    History of Present Illness:    Joanne Nash is a 83 y.o. female that presents to clinic today for follow up evaluation and management of chronic left lateral hip pain suspected to be due to trochanteric bursitis and chronic gluteus medius tear.  They were last evaluated by myself on 06/13/2023.  At that time, the plan was to repeat ultrasound-guided trochanteric bursa injection.  She reports excellent symptom relief for about 5 months.  She follows up today for reevaluation due to progressively worsening symptoms without new injury.  At the time of this visit, there was no new information to review.  Her most recent labs from 07/31/2023 show creatinine 0.7, normal electrolytes, normal TSH.  She has previous left hip x-rays from 12/04/2018 showing mild-moderate central joint space narrowing with minimal lateral hip enthesopathic change.  Today, the patient reports their symptoms are intermittent and sharp over her left lateral hip.  She denies any radiation of symptoms.  She currently rates pain severity as 0/10 at rest but more moderate with on her side, direct palpation, and walking.  She reports associated limping, weakness.  She denies associated swelling, hip locking/catching, fevers or chills, night sweats, weight loss, skin color change, pain at night.  She has also been using a cane and sometimes a rollator for ambulatory assistance, meloxicam, home exercise.  She has been restarted on gabapentin 100 mg 3 times a day by neurosurgery.  She stays active with chair exercise classes a few times a week.  Medications, Past Medical/Surgical/Family/Social History:   Current Outpatient Medications  Medication Sig Dispense Refill  . amoxicillin (AMOXIL) 500 MG capsule TAKE 4 CAPSULES BY MOUTH 1 HOUR PRIOR TO DENTAL APPT    . cholecalciferol (VITAMIN D3)  2,000 unit capsule Take 2,000 Units by mouth once daily.      . citalopram  (CELEXA ) 10 MG tablet take 1 tablet by mouth every day 90 tablet 1  . clotrimazole (LOTRIMIN) 1 % cream APPLY TWICE DAILY TO AFFECTED AREAS (UPPER TORSO) UNTIL CLEAR    . cycloSPORINE  (RESTASIS ) 0.05 % ophthalmic emulsion Place 1 drop into both eyes 2 (two) times daily.    SABRA gabapentin (NEURONTIN) 100 MG capsule Take 1 capsule by mouth every 8 (eight) hours    . levothyroxine  (SYNTHROID ) 75 MCG tablet TAKE 1 TAB BY MOUTH ONCE DAILY ON AN EMPTY STOMACH WITH WATER AT LEAST 30-60 MIN BEFORE BREAKFAST. 90 tablet 1  . MULTIVITAMIN (MULTI-DAY ORAL) Take 1 tablet by mouth once daily.    SABRA omeprazole (PRILOSEC) 20 MG DR capsule take 1 capsule by mouth every day 90 capsule 1  . oxyBUTYnin (DITROPAN-XL) 10 MG XL tablet take 1 tablet by mouth every day 90 tablet 3  . psyllium husk, with sugar, (METAMUCIL) 3.4 gram/12 gram oral powder Take 3.4 g by mouth once daily Mix with a full glass (240 mL) of fluid.    . simvastatin  (ZOCOR ) 20 MG tablet Take 1 tablet (20 mg total) by mouth at bedtime 90 tablet 3  . triamcinolone  0.1 % cream Apply 1 Application topically 2 (two) times daily     No current facility-administered medications for this visit.    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 Psych: Normal mood  and affect.  Conversant.  Judgement intact. Musculoskeletal: Comprehensive Hip Exam: Inspection Gait Antalgic with Trendelenburg gait and cane for ambulatory assistance     Right Left  Skin Normal appearance with no obvious deformity.  No ecchymosis, erythema, rash, or lesions. Normal appearance with no obvious deformity.  No ecchymosis, erythema, rash, or lesions.   Tenderness                                         Right Left  No bony, muscular, or bursal TTP + trochanteric bursa TTP    ROM  Right Left  Flexion/Extension Full to 110 without pain.  Limited extension. Full to 110 without pain.  Limited extension.  90 degree IR Normal IR to 15 degrees without pain Normal IR to 15 degrees without pain  90 degree ER Normal ER to 30 degrees without pain Normal ER to 30 degrees without pain                             Special Tests   Right Left  SLR Negative Negative  FABER Normal Normal  FADIR Negative Negative    Motor   Right Left  Hip Flexion 5/5 5/5  Hip Abduction 4/5 2/5  Knee Extension 5/5 5/5  Knee Flexion 5/5 5/5   Ankle Dorsiflexion 5/5 5/5  Great Toe Extension 5/5 5/5  Ankle Plantarflexion 5/5 5/5  Eversion 5/5 5/5  Inversion 5/5 5/5   Sensory   Right Left  Light Touch Normal light touch sensation Normal light touch sensation   Reflexes   Right Left  Patellar 2+ 2+  Achilles 2+ 2+   Vascular   Right Left  Distal Pulse Normal DP/PT Normal DP/PT   Tests Performed/Ordered:   None  Assessment:     ICD-10-CM  1. Chronic left hip pain  M25.552   G89.29  2. Trochanteric bursitis of left hip  M70.62  3. Tear of left gluteus medius tendon, sequela  S76.012S  4. Primary osteoarthritis of left hip  M16.12  5. Enthesopathy of left hip region  M76.892    Plan:   I have discussed the nature of her current subjective complaints, clinical examination, test results and have reviewed treatment options.  The plan is to do the following;  - The patient has  chronic left lateral hip pain suspected to be due to a chronic gluteus medius tear and bursitis.  She has gotten best relief from treatment of her lateral hip with steroid injections, however, her symptoms do tend to recur. - She still has no major abnormalities neurologic exam but continued noted weakness in left hip abduction and Trendelenburg gait.  She has no pain with hip joint range of motion.  I reiterated that she likely has a gluteus medius tendon tear and given her long-term symptoms she would not be a good surgical candidate.  She does not wish to consider surgery anyways.  I discussed  the conservative treatments for suspected gluteal tendon tear with trochanteric pain syndrome as home exercise, physical therapy, ambulatory aid, medication, and steroid injection as needed.  She will continue to do home exercises and would like to have repeat steroid injections as she does get some pain relief from this since the injections do tend to help for a few months.  See procedure note for full details. - Activity as tolerated. Modify as needed according to symptoms. No limitations to weight bearing.  No need for immobilization or bracing.  She can use a cane or walker as needed for ambulatory assistance. - Continue home exercise program to maintain strength, flexibility, and endurance. - Use chronic medications, Tylenol , anti-inflammatories, topical diclofenac/pain cream, relative rest, massage, and ice/heat as needed for pain.   - Follow up as needed in 3-4 months for reevaluation and consideration of steroid injection.  LEFT ultrasound guided greater trochanteric bursa Injection   Consent After discussing the various treatment options for the condition,  It was agreed that a corticosteroid injection would be the next step in treatment.  The nature of and the indications for a corticosteroid and / or local anaesthetic injection were reviewed in detail with the patient today.  The inherent risks of  injection including infection, allergic reaction, increased pain, incomplete relief or temporary relief of symptoms, alterations of blood glucose levels requiring careful monitoring and treatment as indicated, tendon, ligament or articular cartilage rupture or degeneration, nerve injury, skin depigmentation, and/or fatty atrophy were discussed.    Indication for ultrasound Inability to precisely localize the target using palpation or surface landmarks due to body habitus   Procedure After the risks and benefits of the procedure were explained, consent was given, and time-out was performed.  The left lateral hip and surrounding structures were visualized with ultrasound.  There was minimal bursal effusion with apparent full thickness tear to the gluteus medius tendon. The site for the injection was properly marked and prepped with Chlorhexadine/Isopropyl alcohol  solution.      The injection site was anesthetized with ethyl chloride and 3cc of 1% Lidocaine  using a 22 gauge 3.5 inch needle. Using ultrasound guidance, the greater trochanteric bursa was visualized and injected with 6 milligrams of Betamethasone , 2 milliliters of 0.25% Bupivacaine , and 2 milliliters of 1% Lidocaine  using a sterile technique and a 22 gauge 3.5 inch needle. During injection, there was unrestricted flow and care was taken not to inject corticosteroid into the skin or subcutaneous tissues.    A sterile band-aide was applied.  Post-injection instructions were given regarding post-procedure care, when to follow up in clinic and what to expect from the procedure.  The patient tolerated the injection well and was discharged without complication.     Contact our office with any questions or concerns.  Follow up as indicated, or sooner should any new problems arise, if conditions worsen, or if they are otherwise concerned.    Prentice Reges, DO [facility] [address] [place],  [place] [address] Phone: [phone]  This note was generated in part with voice recognition software and I apologize for any typographical errors that were not detected and corrected.  Large Joint Injection: L greater trochanteric bursa  Date/Time: 12/11/2023 9:20 AM  Performed by: Reges Prentice, DO Authorized by: Reges Prentice, DO   Procedure discussed: discussed risks, benefits, and alternatives   Consent Given by:  Patient Timeout: timeout called immediately prior to procedure   Prep: patient was prepped and draped in usual  sterile fashion   Indications:  Pain Needle Size:  22 G Guidance: ultrasound   Location:  Hip Site:  L greater trochanteric bursa Topical skin anesthesia: obtained using ethyl chloride spray   Medications:  3 mL lidocaine  1 %; 6 mg betamethasone  acetate-betamethasone  sodium phosphate 6 mg/mL; 2 mL lidocaine  1 %; 2 mL BUPivacaine  HCl 0.25 %

## 2024-01-20 ENCOUNTER — Emergency Department
Admission: EM | Admit: 2024-01-20 | Discharge: 2024-01-20 | Disposition: A | Discharge status: Home / Self Care | Attending: Emergency Medicine | Admitting: Emergency Medicine

## 2024-01-20 ENCOUNTER — Emergency Department

## 2024-01-20 DIAGNOSIS — M8580 Other specified disorders of bone density and structure, unspecified site: Secondary | ICD-10-CM | POA: Diagnosis not present

## 2024-01-20 DIAGNOSIS — Z471 Aftercare following joint replacement surgery: Secondary | ICD-10-CM | POA: Diagnosis not present

## 2024-01-20 DIAGNOSIS — M25552 Pain in left hip: Secondary | ICD-10-CM | POA: Insufficient documentation

## 2024-01-20 DIAGNOSIS — M25562 Pain in left knee: Secondary | ICD-10-CM | POA: Diagnosis not present

## 2024-01-20 DIAGNOSIS — K4020 Bilateral inguinal hernia, without obstruction or gangrene, not specified as recurrent: Secondary | ICD-10-CM | POA: Diagnosis not present

## 2024-01-20 DIAGNOSIS — I10 Essential (primary) hypertension: Secondary | ICD-10-CM | POA: Diagnosis not present

## 2024-01-20 DIAGNOSIS — R0902 Hypoxemia: Secondary | ICD-10-CM | POA: Diagnosis not present

## 2024-01-20 DIAGNOSIS — Z96652 Presence of left artificial knee joint: Secondary | ICD-10-CM | POA: Diagnosis not present

## 2024-01-20 DIAGNOSIS — S72002A Fracture of unspecified part of neck of left femur, initial encounter for closed fracture: Secondary | ICD-10-CM | POA: Diagnosis not present

## 2024-01-20 MED ORDER — OXYCODONE HCL 5 MG PO TABS: 2.5000 mg | ORAL_TABLET | Freq: Four times a day (QID) | ORAL | 0 refills | Status: AC | PRN

## 2024-01-20 MED ORDER — LIDOCAINE 5 % EX PTCH: 1.0000 | MEDICATED_PATCH | CUTANEOUS | 0 refills | Status: AC

## 2024-01-20 MED ORDER — KETOROLAC TROMETHAMINE 15 MG/ML IJ SOLN
15.0000 mg | Freq: Once | INTRAMUSCULAR | Status: AC
  Administered 2024-01-20: 15 mg via INTRAMUSCULAR
  Filled 2024-01-20: qty 1

## 2024-01-20 MED ORDER — LIDOCAINE 5 % EX PTCH
1.0000 | MEDICATED_PATCH | CUTANEOUS | Status: DC
  Administered 2024-01-20: 1 via TRANSDERMAL
  Filled 2024-01-20: qty 1

## 2024-01-20 MED ORDER — OXYCODONE HCL 5 MG PO TABS
5.0000 mg | ORAL_TABLET | Freq: Once | ORAL | Status: AC
  Administered 2024-01-20: 5 mg via ORAL
  Filled 2024-01-20: qty 1

## 2024-01-20 NOTE — ED Notes (Signed)
 Patient was able to ambulate without assistance from this RN with her walker.

## 2024-01-20 NOTE — ED Triage Notes (Signed)
 Pt to ED from home with left hip pain since yesterday. Pt endorses pain starting in the knee up to the hip. No leg shortening noted, dorsal pulses present.   Hx back surgery

## 2024-01-20 NOTE — Discharge Instructions (Addendum)
 You can take Tylenol  650mg  every 6 hours. Max 3000mg  one day You can take Ibuprofen  200-400 every 6 hours with food. Alternate these to help with pain. For one week.  Use lidocaine  patches for pain Oxycodone  for break through pain. Take capful miralax to prevent constipation   IMPRESSION: 1. No acute or focal lesion to explain the patient's symptoms. 2. Solid fusion at L4-5 and L5-S1. 3. Adjacent level disease at L4-5 secondary to a broad-based disc protrusion and advanced facet hypertrophy. 4. Degenerative changes in the SI joints bilaterally. 5. Small right paraumbilical hernia containing fat without bowel. 6. Small bilateral inguinal hernias containing fat without bowel.  Take oxycodone  as prescribed. Do not drink alcohol , drive or participate in any other potentially dangerous activities while taking this medication as it may make you sleepy. Do not take this medication with any other sedating medications, either prescription or over-the-counter. If you were prescribed Percocet or Vicodin, do not take these with acetaminophen  (Tylenol ) as it is already contained within these medications.  This medication is an opiate (or narcotic) pain medication and can be habit forming. Use it as little as possible to achieve adequate pain control. Do not use or use it with extreme caution if you have a history of opiate abuse or dependence. If you are on a pain contract with your primary care doctor or a pain specialist, be sure to let them know you were prescribed this medication today from the Emergency Department. This medication is intended for your use only - do not give any to anyone else and keep it in a secure place where nobody else, especially children, have access to it.

## 2024-01-20 NOTE — ED Provider Notes (Addendum)
 [facility] Provider Note    Event Date/Time   First MD Initiated Contact with Patient 01/20/24 1210     (approximate)   History   No chief complaint on file.   HPI  Joanne Nash is a 83 y.o. female with history of hip pain who comes in with concerns for left hip pain.  Patient denies any falls.  This has been gradual getting worse but stated that today it was worse than normal.  She denies taking anything other than some Tylenol  for the pain.  She denies any falls or landing on it.  She denies any back pain.  She reports the pain is just in her left hip. No fever no rash    Patient was seen on 12/11/2023 by the orthopedic team at [facility] clinic for her left hip pain.  She is got known chronic left hip pain with trochanteric bursitis, chronic gluteus medius tear.  She has had prior steroid injections.  They had recommended surgery but patient had declined    Physical Exam   Triage Vital Signs: ED Triage Vitals  Encounter Vitals Group     BP      Systolic BP Percentile      Diastolic BP Percentile      Pulse      Resp      Temp      Temp src      SpO2      Weight      Height      Head Circumference      Peak Flow      Pain Score      Pain Loc      Pain Education      Exclude from Growth Chart     Most recent vital signs: Vitals:   01/20/24 1214  BP: (!) 159/83  Pulse: 62  Resp: 18  Temp: 98.4 F (36.9 C)  SpO2: 99%     General: Awake, no distress.  CV:  Good peripheral perfusion.  Resp:  Normal effort.  Abd:  No distention.  Other:  Tenderness of the left hip.  Tenderness with movement.  No swelling of the leg no calf tenderness good distal pulse.  No rash noted.  No CTL spine tenderness.  Able to lift the leg up off the bed No swelling in the joints.  No redness of the joints.   No rash over joint  No numbness of the legs   ED Results / Procedures / Treatments   Labs (all labs ordered are listed, but only abnormal results are  displayed) Labs Reviewed - No data to display   RADIOLOGY I have reviewed the xray personally and interpreted no evidence of any fracture.   PROCEDURES:  Critical Care performed: No  Procedures   MEDICATIONS ORDERED IN ED: Medications  lidocaine  (LIDODERM ) 5 % 1 patch (1 patch Transdermal Patch Applied 01/20/24 1255)  ketorolac (TORADOL) 15 MG/ML injection 15 mg (15 mg Intramuscular Given 01/20/24 1256)  oxyCODONE  (Oxy IR/ROXICODONE ) immediate release tablet 5 mg (5 mg Oral Given 01/20/24 1253)     IMPRESSION / MDM / ASSESSMENT AND PLAN / ED COURSE  I reviewed the triage vital signs and the nursing notes.   Patient's presentation is most consistent with acute, uncomplicated illness.   Patient comes in with left hip pain.  This seems like more of a chronic process but given her increasing pain she came into the emergency room.  X-ray was ordered  to evaluate for any new fractures.  She is got no CTL spine tenderness no new fall.  Patient's recent creatinine in November 2024 was normal so we will give 1 dose of IM Toradol, lidocaine  patch, oxycodone  to help with pain.  Examination is not consistent with DVT, septic joint or arterial process.  IMPRESSION: Nondisplaced transverse subcapital femoral neck fracture.  IMPRESSION: Status post tricompartmental knee replacement without hardware complication.  I did not see the fracture thought it was more shadowing but the radiologist was concerned for a fracture.  I discussed the case with Dr. Annabell Key from orthopedics who agreed that this could just be shadowing and did recommend a CT pelvis to ensure that this was actually a fracture.   IMPRESSION: 1. No acute or focal lesion to explain the patient's symptoms. 2. Solid fusion at L4-5 and L5-S1. 3. Adjacent level disease at L4-5 secondary to a broad-based disc protrusion and advanced facet hypertrophy. 4. Degenerative changes in the SI joints bilaterally. 5. Small right paraumbilical  hernia containing fat without bowel. 6. Small bilateral inguinal hernias containing fat without bowel.    D/w Audree Leas to ensure no fracture on CT and he did take a look at no fracture noted.   Discussed with family and they feel comfortable with discharge home.  They are going to see if patient can ambulate.  We discussed symptomatic treatment with Tylenol , ibuprofen , lidocaine  patches we discussed pros and cons of oxycodone  and the risk for falls.  They want to have a little bit of oxycodone  at home to use as needed.  Recommend starting off with a half of a tablet and at nighttime and only during the day if it is really needed.  They expressed understanding.  They are going to call her orthopedic doctor to make a follow-up appointment.  I did call family back to let them know that some of the pain could be related to the herniated disc in the back.  I did give them neurosurgeons number to follow-up with outpatient.  She had no symptoms of cord compression no difficulties with urinating or urinating on herself, no stool incontinence no numbness or weakness on examination.  We discussed these return precautions and they expressed understanding and felt comfortable with this plan   FINAL CLINICAL IMPRESSION(S) / ED DIAGNOSES   Final diagnoses:  Left hip pain     Rx / DC Orders   ED Discharge Orders          Ordered    oxyCODONE  (ROXICODONE ) 5 MG immediate release tablet  Every 6 hours PRN        01/20/24 1404             Note:  This document was prepared using Dragon voice recognition software and may include unintentional dictation errors.   Lubertha Rush, MD 01/20/24 1411    Lubertha Rush, MD 01/20/24 [id number]

## 2024-02-01 DIAGNOSIS — S76012D Strain of muscle, fascia and tendon of left hip, subsequent encounter: Secondary | ICD-10-CM | POA: Diagnosis not present

## 2024-02-01 DIAGNOSIS — M25552 Pain in left hip: Secondary | ICD-10-CM | POA: Diagnosis not present

## 2024-02-01 DIAGNOSIS — M7062 Trochanteric bursitis, left hip: Secondary | ICD-10-CM | POA: Diagnosis not present

## 2024-02-01 DIAGNOSIS — M533 Sacrococcygeal disorders, not elsewhere classified: Secondary | ICD-10-CM | POA: Diagnosis not present

## 2024-02-01 DIAGNOSIS — M76892 Other specified enthesopathies of left lower limb, excluding foot: Secondary | ICD-10-CM | POA: Diagnosis not present

## 2024-02-01 DIAGNOSIS — G8929 Other chronic pain: Secondary | ICD-10-CM | POA: Diagnosis not present

## 2024-02-01 DIAGNOSIS — M1612 Unilateral primary osteoarthritis, left hip: Secondary | ICD-10-CM | POA: Diagnosis not present

## 2024-02-01 NOTE — Progress Notes (Signed)
 [facility] Chief Complaint:   Chief Complaint  Patient presents with  . Left Hip - Pain, Follow-up    History of Present Illness:    Joanne Nash is a 83 y.o. female that presents to clinic today for follow up evaluation and management of acute exacerbation of chronic left hip pain previously treated for chronic gluteus medius tear and bursitis.  They were last evaluated by myself on 12/11/2023.  At that time, the plan was to repeat trochanteric bursa steroid injection.  She does well with this but had acute exacerbation of left hip and back pain which prompted evaluation by the emergency department.  She is accompanied by her daughter.  At the time of this visit, I reviewed her evaluation by the [facility] emergency department on 01/20/2024.  She had acute onset left hip pain that was quite immobilizing so she presented to the emergency department.  She had evaluation with left hip x-ray where there was question of nondisplaced subcapital femoral neck fracture.  CT scan of the pelvis was obtained which did not corroborate that finding.  She was treated with intramuscular Toradol , lidocaine  patch, oxycodone , and recommended follow-up outpatient.  Her most recent labs from 07/31/2023 show creatinine 0.7, normal electrolytes, normal TSH.    Today, the patient reports their symptoms are improved but still present around her left SI joint and posterior/superior buttock.  She can have some lateral hip pains but no symptoms radiating down her leg.  Her pain is constant and aching with intermittent sharp episodes.  It is aggravated by position changes, transitioning from sit to stand, walking.  She currently rates pain severity as a 7/10.  She reports associated limping, weakness.  She denies associated swelling, hip locking/catching, fevers or chills, night sweats, weight loss, skin color change, pain at night.  She has also been using a cane and sometimes a rollator for  ambulatory assistance, meloxicam, home exercise.  She was recently restarted on gabapentin 100 mg 3 times a day by neurosurgery.   She stays active with chair exercise classes a few times a week.  Medications, Past Medical/Surgical/Family/Social History:   Current Outpatient Medications  Medication Sig Dispense Refill  . amoxicillin (AMOXIL) 500 MG capsule TAKE 4 CAPSULES BY MOUTH 1 HOUR PRIOR TO DENTAL APPT    . cholecalciferol (VITAMIN D3) 2,000 unit capsule Take 2,000 Units by mouth once daily.      . citalopram  (CELEXA ) 10 MG tablet take 1 tablet by mouth every day 90 tablet 1  . clotrimazole (LOTRIMIN) 1 % cream APPLY TWICE DAILY TO AFFECTED AREAS (UPPER TORSO) UNTIL CLEAR    . cycloSPORINE  (RESTASIS ) 0.05 % ophthalmic emulsion Place 1 drop into both eyes 2 (two) times daily.    SABRA gabapentin (NEURONTIN) 100 MG capsule Take 1 capsule by mouth every 8 (eight) hours    . levothyroxine  (SYNTHROID ) 75 MCG tablet TAKE 1 TAB BY MOUTH ONCE DAILY ON AN EMPTY STOMACH WITH WATER AT LEAST 30-60 MIN BEFORE BREAKFAST. 90 tablet 1  . MULTIVITAMIN (MULTI-DAY ORAL) Take 1 tablet by mouth once daily.    SABRA omeprazole (PRILOSEC) 20 MG DR capsule TAKE 1 CAPSULE BY MOUTH EVERY DAY 90 capsule 1  . oxyBUTYnin (DITROPAN-XL) 10 MG XL tablet take 1 tablet by mouth every day 90 tablet 3  . psyllium husk, with sugar, (METAMUCIL) 3.4 gram/12 gram oral powder Take 3.4 g by mouth once daily Mix with a full glass (240 mL) of fluid.    SABRA  simvastatin  (ZOCOR ) 20 MG tablet Take 1 tablet (20 mg total) by mouth at bedtime 90 tablet 3  . triamcinolone  0.1 % cream Apply 1 Application topically 2 (two) times daily     No current facility-administered medications for this visit.    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 Psych: Normal mood and affect.  Conversant.  Judgement intact. Musculoskeletal: Comprehensive Hip Exam: Inspection Gait Antalgic with Trendelenburg gait and cane for ambulatory assistance      Right Left  Skin Normal appearance with no obvious deformity.  No ecchymosis, erythema, rash, or lesions. Normal appearance with no obvious deformity.  No ecchymosis, erythema, rash, or lesions.    Tenderness                                         Right Left  No bony, muscular, or bursal TTP + SI joint, posterior hip muscles > trochanteric bursa TTP    ROM   Right Left  Flexion/Extension Full to 110 without pain.  Limited extension. Full to 110 with SI joint pain.  Limited extension.  90 degree IR Normal IR to 15 degrees without pain Normal IR to 15 degrees without pain  90 degree ER Normal ER to 30 degrees without pain Normal ER to 30 degrees with SI joint pain                             Special Tests   Right Left  SLR Negative Negative  FABER Normal + positive for SI joint pain  FADIR Negative Negative    Motor   Right Left  Knee Extension 5/5 5/5  Knee Flexion 5/5 5/5    Ankle Dorsiflexion 5/5 5/5  Great Toe Extension 5/5 5/5  Ankle Plantarflexion 5/5 5/5  Eversion 5/5 5/5  Inversion 5/5 5/5    Sensory   Right Left  Light Touch Normal light touch sensation Normal light touch sensation    Reflexes   Right Left  Patellar 2+ 2+  Achilles 2+ 2+    Vascular   Right Left  Distal Pulse Normal DP/PT Normal DP/PT    Tests Performed/Ordered:    None  Assessment:     ICD-10-CM  1. Chronic left hip pain  M25.552   G89.29  2. Sacroiliac joint pain  M53.3  3. Trochanteric bursitis of left hip  M70.62  4. Tear of left gluteus medius tendon, sequela  S76.012S  5. Primary osteoarthritis of left hip  M16.12  6. Enthesopathy of left hip region  M76.892    Plan:   I have discussed the nature of her current subjective complaints, clinical examination, test results and have reviewed treatment options.  The plan is to do the following;  - The patient has an acute exacerbation of chronic left hip  pain without injury.  She has been getting treatment for hip abductor insufficiency and trochanteric bursitis with repeat steroid injections that usually help.  Her current pain seems to be due to her SI joint but possibly myofascial pain versus lumbar radicular pain. - She has most pain with palpation over her SI joint.  Hip motion causes SI joint pain.  No obvious abnormality to neurologic exam.  I reviewed her emergency department workup with her.  I discussed the possible causes of her symptoms with suspicion of SI joint dysfunction.  I explained the treatment options as ambulatory aid, physical therapy, medication, topical pain cream, steroid injection but she could also consider having further evaluation into her lumbar spine by her neurosurgery team or physiatry.  She has done some medications with ongoing symptoms so she would like to try a steroid injection to the SI joint.  See procedure note for full details. - Activity as tolerated. Modify as needed according to symptoms. No limitations to weight bearing.  No need for immobilization or bracing.  She can use a cane or walker as needed for ambulatory assistance. - Continue home exercise program to maintain strength, flexibility, and endurance. - Use chronic medications, Tylenol , anti-inflammatories, topical diclofenac/pain cream/pain patch, relative rest, massage, compression, and ice/heat as  needed for pain.   - Follow up as needed in 3-4 weeks depending on response to injection.  She may need formal physical therapy versus consultation with neurosurgery or physiatry.  LEFT ultrasound guided SI joint Injection   Consent After discussing the various treatment options for the condition,  It was agreed that a corticosteroid injection would be the next step in treatment.  The nature of and the indications for a corticosteroid and / or local anaesthetic injection were reviewed in detail with the patient today.  The inherent risks of injection including infection, allergic reaction, increased pain, incomplete relief or temporary relief of symptoms, alterations of blood glucose levels requiring careful monitoring and treatment as indicated, tendon, ligament or articular cartilage rupture or degeneration, nerve injury, skin depigmentation, and/or fatty atrophy were discussed.    Indication for ultrasound Ultrasound-guided needle placement was indicated during this procedure due to the deep location of the target structure and its proximity to neurovascular structures. Diagnostic injection where accurate injectate placement is critical for diagnosis.   Procedure After the risks and benefits of the procedure were explained, consent was given, and time-out was performed.  The left SI joint and surrounding structures were visualized with ultrasound.  There was no joint effusion.  The site for the injection was properly marked and prepped with Chlorhexadine/Isopropyl alcohol  solution.      The injection site was anesthetized with ethyl chloride. Using ultrasound guidance, the SI joint was visualized and injected with 3 milligrams of Betamethasone , 1 milliliters of 0.25% Bupivacaine , and 1 milliliters of 1% Lidocaine  using a sterile technique and a 22 gauge 3.5 inch needle. During injection, there was unrestricted flow and care was taken not to inject corticosteroid into the skin or subcutaneous  tissues.    A sterile band-aide was applied.  Post-injection instructions were given regarding post-procedure care, when to follow up in clinic and what to expect from the procedure.  The patient tolerated the injection well and was discharged without complication.     Contact our office with any questions or concerns.  Follow up as indicated, or sooner should any new problems arise, if conditions worsen, or if they are otherwise concerned.    Prentice Reges, DO  [facility] [address] [place], [place] [address] Phone: [phone]   This note was generated in part with voice recognition software and I apologize for any typographical errors that were not detected and corrected.   Trigger Point Injection - Left SI joint  Date/Time: 02/01/2024 9:30 AM  Performed by: Sharrie Barter, DO Authorized by: Sharrie Barter, DO   Body location: sacral Number of Injections:  1-2 injection points Topical skin anesthesia: obtained using ethyl chloride spray   Medications:  1 mL BUPivacaine  HCl 0.25 %; 1 mL lidocaine  1 %; 0.5 mL betamethasone  acetate-betamethasone  sodium phosphate 6 mg/mL Needle Gauge:  22 spinal Instructions: patient was counseled as to the expected post inection course, including the possibility of temporary worsening of symptoms   patient was instructed as to concerning symptoms or signs and instructed to contact the office if these should appear   Procedure discussed: discussed risks, benefits, and alternatives   Consent Given by:  Patient Timeout: timeout called immediately prior to procedure

## 2024-02-04 DIAGNOSIS — E034 Atrophy of thyroid (acquired): Secondary | ICD-10-CM | POA: Diagnosis not present

## 2024-02-07 DIAGNOSIS — E034 Atrophy of thyroid (acquired): Secondary | ICD-10-CM | POA: Diagnosis not present

## 2024-02-12 DIAGNOSIS — Z0001 Encounter for general adult medical examination with abnormal findings: Secondary | ICD-10-CM | POA: Diagnosis not present

## 2024-02-12 DIAGNOSIS — G4733 Obstructive sleep apnea (adult) (pediatric): Secondary | ICD-10-CM | POA: Diagnosis not present

## 2024-02-12 DIAGNOSIS — D696 Thrombocytopenia, unspecified: Secondary | ICD-10-CM | POA: Diagnosis not present

## 2024-02-12 DIAGNOSIS — Z0000 Encounter for general adult medical examination without abnormal findings: Secondary | ICD-10-CM | POA: Diagnosis not present

## 2024-02-12 DIAGNOSIS — E7800 Pure hypercholesterolemia, unspecified: Secondary | ICD-10-CM | POA: Diagnosis not present

## 2024-02-12 DIAGNOSIS — Z1331 Encounter for screening for depression: Secondary | ICD-10-CM | POA: Diagnosis not present

## 2024-02-12 DIAGNOSIS — E034 Atrophy of thyroid (acquired): Secondary | ICD-10-CM | POA: Diagnosis not present

## 2024-02-12 DIAGNOSIS — M810 Age-related osteoporosis without current pathological fracture: Secondary | ICD-10-CM | POA: Diagnosis not present

## 2024-02-29 DIAGNOSIS — M1612 Unilateral primary osteoarthritis, left hip: Secondary | ICD-10-CM | POA: Diagnosis not present

## 2024-02-29 DIAGNOSIS — M533 Sacrococcygeal disorders, not elsewhere classified: Secondary | ICD-10-CM | POA: Diagnosis not present

## 2024-02-29 DIAGNOSIS — M76892 Other specified enthesopathies of left lower limb, excluding foot: Secondary | ICD-10-CM | POA: Diagnosis not present

## 2024-02-29 DIAGNOSIS — M62838 Other muscle spasm: Secondary | ICD-10-CM | POA: Diagnosis not present

## 2024-02-29 DIAGNOSIS — S76012D Strain of muscle, fascia and tendon of left hip, subsequent encounter: Secondary | ICD-10-CM | POA: Diagnosis not present

## 2024-02-29 DIAGNOSIS — M7062 Trochanteric bursitis, left hip: Secondary | ICD-10-CM | POA: Diagnosis not present

## 2024-02-29 DIAGNOSIS — G8929 Other chronic pain: Secondary | ICD-10-CM | POA: Diagnosis not present

## 2024-02-29 DIAGNOSIS — M25552 Pain in left hip: Secondary | ICD-10-CM | POA: Diagnosis not present

## 2024-02-29 DIAGNOSIS — M4326 Fusion of spine, lumbar region: Secondary | ICD-10-CM | POA: Diagnosis not present

## 2024-02-29 NOTE — Progress Notes (Signed)
 [facility] Chief Complaint:   Chief Complaint  Patient presents with  . Left Hip - Follow-up, Pain    History of Present Illness:    Joanne Nash is a 83 y.o. female that presents to clinic today for follow up evaluation and management of acute on chronic hip pains.  They were last evaluated by myself on 02/01/2024.  At that time, the plan was to perform a left SI joint steroid injection.  She did not notice significant improvement in her symptoms from this.  She follows up today for routine reevaluation.  She is again accompanied by her daughter.  There is no new data to be reviewed.  She had an evaluation by the [facility] emergency department was on 01/20/2024.  She was coming in for acute onset left hip pain that was quite immobilizing.  She had evaluation with left hip x-ray where there was question of nondisplaced subcapital femoral neck fracture.  CT scan of the pelvis was obtained which did not corroborate that finding.  She was treated with intramuscular Toradol , lidocaine  patch, oxycodone , and recommended follow-up outpatient.   Today, the patient reports their symptoms are still located over her posterior/superior hip around her SI joint and gluteal area.  She denies any significant radiation into her leg.  She currently rates pain severity as a 4/10 at rest but 6/10 with walking.  She reports associated limping, weakness.  She denies associated swelling, hip locking/catching, fevers or chills, night sweats, weight loss, skin color change, pain at night.  She has also been using a cane and sometimes a rollator for ambulatory assistance, meloxicam, home exercise.  She was recently restarted on gabapentin 100 mg 3 times a day by neurosurgery.  She is followed chronically for gluteal tendon pathology and last had a treatment with steroid injection on 12/11/2023.   She stays active with chair exercise classes a few times a week.  She feels like her  symptoms are tolerable but she does make concessions to activities to avoid pain.  Medications, Past Medical/Surgical/Family/Social History:   Current Outpatient Medications  Medication Sig Dispense Refill  . amoxicillin (AMOXIL) 500 MG capsule TAKE 4 CAPSULES BY MOUTH 1 HOUR PRIOR TO DENTAL APPT    . cholecalciferol (VITAMIN D3) 2,000 unit capsule Take 2,000 Units by mouth once daily.      . citalopram  (CELEXA ) 10 MG tablet take 1 tablet by mouth every day 90 tablet 1  . clotrimazole (LOTRIMIN) 1 % cream APPLY TWICE DAILY TO AFFECTED AREAS (UPPER TORSO) UNTIL CLEAR    . cycloSPORINE  (RESTASIS ) 0.05 % ophthalmic emulsion Place 1 drop into both eyes 2 (two) times daily.    SABRA gabapentin (NEURONTIN) 100 MG capsule Take 1 capsule by mouth every 8 (eight) hours    . levothyroxine  (SYNTHROID ) 75 MCG tablet TAKE 1 TAB BY MOUTH ONCE DAILY ON AN EMPTY STOMACH WITH WATER AT LEAST 30-60 MIN BEFORE BREAKFAST. 90 tablet 1  . MULTIVITAMIN (MULTI-DAY ORAL) Take 1 tablet by mouth once daily.    SABRA omeprazole (PRILOSEC) 20 MG DR capsule TAKE 1 CAPSULE BY MOUTH EVERY DAY 90 capsule 1  . oxyBUTYnin (DITROPAN-XL) 10 MG XL tablet take 1 tablet by mouth every day 90 tablet 3  . psyllium husk, with sugar, (METAMUCIL) 3.4 gram/12 gram oral powder Take 3.4 g by mouth once daily Mix with a full glass (240 mL) of fluid.    . simvastatin  (ZOCOR ) 20 MG tablet Take 1 tablet (20  mg total) by mouth at bedtime 90 tablet 3  . triamcinolone  0.1 % cream Apply 1 Application topically 2 (two) times daily     No current facility-administered medications for this visit.    SECONDARY CONDITIONS THAT INFLUENCE TREATMENT AND DECISION-MAKING:  Former smoker   Past Medical History: Obesity, hypothyroid, osteoporosis, hyperlipidemia on statin, GERD on PPI, lumbar spine degenerative disc disease with spondylolisthesis   Past Surgical History: Posterior lumbar spine fusion, left total knee replacement, right total knee replacement,  bilateral lower extremity vein sclerotherapy, D&C   Relevant Orthopedic Family History: Osteoporosis, osteoarthritis  Physical Examination:   BP 130/72   Ht 157.5 cm (5' 2)   Wt 73.5 kg (162 lb)   LMP  (LMP Unknown)   BMI 29.63 kg/m  General/Constitutional: Well-nourished, well developed, no apparent distress. Psych: Normal mood and affect.  Conversant.  Judgement intact. Musculoskeletal: Comprehensive Hip Exam: Inspection Gait Antalgic with Trendelenburg gait and cane for ambulatory assistance      Right Left  Skin Normal appearance with no obvious deformity.  No ecchymosis, erythema, rash, or lesions. Normal appearance with no obvious deformity.  No ecchymosis, erythema, rash, or lesions.    Tenderness                                         Right Left  No bony, muscular, or bursal TTP + SI joint, posterior hip muscles > trochanteric bursa TTP    ROM   Right Left  Flexion/Extension Full to 110 without pain.  Limited extension. Full to 110 with SI joint pain.  Limited extension.  90 degree IR Normal IR to 15 degrees without pain Normal IR to 15 degrees without pain  90 degree ER Normal ER to 30 degrees without pain Normal ER to 30 degrees with SI joint pain                             Special Tests   Right Left  SLR Negative Negative  FABER Normal + positive for SI joint pain  FADIR Negative Negative    Motor   Right Left  Knee Extension 5/5 5/5  Knee Flexion 5/5 5/5    Ankle Dorsiflexion 5/5 5/5  Great Toe Extension 5/5 5/5  Ankle Plantarflexion 5/5 5/5  Eversion 5/5 5/5  Inversion 5/5 5/5    Sensory   Right Left  Light Touch Normal light touch sensation Normal light touch sensation    Reflexes   Right Left  Patellar 2+ 2+  Achilles 2+ 2+    Vascular   Right Left  Distal Pulse Normal DP/PT Normal DP/PT    Tests Performed/Ordered:   None  Tests Previously Reviewed:   - See HPI [facility] ED records) -  I personally reviewed and visualized the imaging  studies if available.   Assessment:     ICD-10-CM  1. Chronic left hip pain  M25.552   G89.29  2. Sacroiliac joint pain  M53.3  3. Trochanteric bursitis of left hip  M70.62  4. Tear of left gluteus medius tendon, sequela  S76.012S  5. Primary osteoarthritis of left hip  M16.12  6. Enthesopathy of left hip region  M76.892  7. Muscle spasm  M62.838  8. Fusion of spine of lumbar region  M43.26    Plan:   I have discussed the nature of her  current subjective complaints, clinical examination, test results and have reviewed treatment options.  The plan is to do the following;  - The patient has continued pain from an acute exacerbation of chronic left hip issues without injury.  She has been getting treatment for chronic hip abductor insufficiency and trochanteric bursitis with steroid injections that usually help.  Her last bursa injection was on 12/11/2023 but her current symptoms feel different.  I explained that she could have SI joint dysfunction versus myofascial pain versus facet pain versus lumbar radicular pain.  She typically follows up with neurosurgery once a year. - She did not get significant improvement from ultrasound-guided SI joint steroid injection on 02/01/2024.  She still feels like the symptoms are different from her trochanteric pain.  She had workup around her hip from the emergency department that did not show any major bony abnormalities to explain her symptoms.  I discussed the possible causes of her symptoms with suspicion for SI joint dysfunction and potential concomitant myofascial pain versus lumbar mediated pain.  I explained the conservative treatment with activity modification, home exercise, physical therapy, ambulatory aid, medication, topical pain cream/patch versus further evaluation with physiatry and/or neurosurgery.  At this point, we decided she will try dry needling treatments with physical therapy. - Activity as tolerated. Modify as needed according to  symptoms. No limitations to weight bearing. No need for immobilization or bracing. She can use a cane or walker as needed for ambulatory assistance.  - Refer to PT for pain relieving modalities, manual techniques as indicated including dry needling, home exercise planning.  Continue home exercise program to maintain strength, flexibility, and endurance. - Use chronic medications, Tylenol , anti-inflammatories, topical diclofenac/pain cream/pain patch, relative rest, massage, compression, and ice/heat as needed for pain.   - Follow up as needed depending on clinical course.  She may need consultation with neurosurgery and/or or physiatry.  I spent a total of 33 minutes in both face-to-face and non face-to-face activities, excluding procedures performed, for this visit on the date of this encounter which included: Patient Encounter, Counseling/Educating on possible underlying etiology of symptoms plus treatment moving forward shared decision making, Documentation, and Preparing orders and/or referrals.  Contact our office with any questions or concerns.  Follow up as indicated, or sooner should any new problems arise, if conditions worsen, or if they are otherwise concerned.    Prentice Reges, DO [facility] [address] [place], [place] [address] Phone: [phone]   This note was generated in part with voice recognition software and I apologize for any typographical errors that were not detected and corrected.

## 2024-04-29 DIAGNOSIS — R262 Difficulty in walking, not elsewhere classified: Secondary | ICD-10-CM | POA: Diagnosis not present

## 2024-04-29 DIAGNOSIS — M6281 Muscle weakness (generalized): Secondary | ICD-10-CM | POA: Diagnosis not present

## 2024-04-29 DIAGNOSIS — M25552 Pain in left hip: Secondary | ICD-10-CM | POA: Diagnosis not present

## 2024-04-29 DIAGNOSIS — M7062 Trochanteric bursitis, left hip: Secondary | ICD-10-CM | POA: Diagnosis not present

## 2024-05-02 DIAGNOSIS — R262 Difficulty in walking, not elsewhere classified: Secondary | ICD-10-CM | POA: Diagnosis not present

## 2024-05-02 DIAGNOSIS — M25552 Pain in left hip: Secondary | ICD-10-CM | POA: Diagnosis not present

## 2024-05-02 DIAGNOSIS — M7062 Trochanteric bursitis, left hip: Secondary | ICD-10-CM | POA: Diagnosis not present

## 2024-05-02 DIAGNOSIS — M6281 Muscle weakness (generalized): Secondary | ICD-10-CM | POA: Diagnosis not present

## 2024-05-07 DIAGNOSIS — H16223 Keratoconjunctivitis sicca, not specified as Sjogren's, bilateral: Secondary | ICD-10-CM | POA: Diagnosis not present

## 2024-05-07 DIAGNOSIS — M25552 Pain in left hip: Secondary | ICD-10-CM | POA: Diagnosis not present

## 2024-05-07 DIAGNOSIS — R262 Difficulty in walking, not elsewhere classified: Secondary | ICD-10-CM | POA: Diagnosis not present

## 2024-05-07 DIAGNOSIS — H2513 Age-related nuclear cataract, bilateral: Secondary | ICD-10-CM | POA: Diagnosis not present

## 2024-05-07 DIAGNOSIS — M6281 Muscle weakness (generalized): Secondary | ICD-10-CM | POA: Diagnosis not present

## 2024-05-07 DIAGNOSIS — M7062 Trochanteric bursitis, left hip: Secondary | ICD-10-CM | POA: Diagnosis not present

## 2024-05-07 DIAGNOSIS — H353132 Nonexudative age-related macular degeneration, bilateral, intermediate dry stage: Secondary | ICD-10-CM | POA: Diagnosis not present

## 2024-05-09 DIAGNOSIS — M25552 Pain in left hip: Secondary | ICD-10-CM | POA: Diagnosis not present

## 2024-05-09 DIAGNOSIS — R262 Difficulty in walking, not elsewhere classified: Secondary | ICD-10-CM | POA: Diagnosis not present

## 2024-05-09 DIAGNOSIS — M6281 Muscle weakness (generalized): Secondary | ICD-10-CM | POA: Diagnosis not present

## 2024-05-09 DIAGNOSIS — M7062 Trochanteric bursitis, left hip: Secondary | ICD-10-CM | POA: Diagnosis not present

## 2024-05-14 DIAGNOSIS — M6281 Muscle weakness (generalized): Secondary | ICD-10-CM | POA: Diagnosis not present

## 2024-05-14 DIAGNOSIS — R262 Difficulty in walking, not elsewhere classified: Secondary | ICD-10-CM | POA: Diagnosis not present

## 2024-05-14 DIAGNOSIS — M7062 Trochanteric bursitis, left hip: Secondary | ICD-10-CM | POA: Diagnosis not present

## 2024-05-14 DIAGNOSIS — M25552 Pain in left hip: Secondary | ICD-10-CM | POA: Diagnosis not present

## 2024-05-16 DIAGNOSIS — M6281 Muscle weakness (generalized): Secondary | ICD-10-CM | POA: Diagnosis not present

## 2024-05-16 DIAGNOSIS — M7062 Trochanteric bursitis, left hip: Secondary | ICD-10-CM | POA: Diagnosis not present

## 2024-05-16 DIAGNOSIS — R262 Difficulty in walking, not elsewhere classified: Secondary | ICD-10-CM | POA: Diagnosis not present

## 2024-05-16 DIAGNOSIS — M25552 Pain in left hip: Secondary | ICD-10-CM | POA: Diagnosis not present

## 2024-05-19 DIAGNOSIS — M7062 Trochanteric bursitis, left hip: Secondary | ICD-10-CM | POA: Diagnosis not present

## 2024-05-19 DIAGNOSIS — M6281 Muscle weakness (generalized): Secondary | ICD-10-CM | POA: Diagnosis not present

## 2024-05-19 DIAGNOSIS — M25552 Pain in left hip: Secondary | ICD-10-CM | POA: Diagnosis not present

## 2024-05-19 DIAGNOSIS — R262 Difficulty in walking, not elsewhere classified: Secondary | ICD-10-CM | POA: Diagnosis not present

## 2024-05-21 DIAGNOSIS — M25552 Pain in left hip: Secondary | ICD-10-CM | POA: Diagnosis not present

## 2024-05-21 DIAGNOSIS — M7062 Trochanteric bursitis, left hip: Secondary | ICD-10-CM | POA: Diagnosis not present

## 2024-05-21 DIAGNOSIS — R262 Difficulty in walking, not elsewhere classified: Secondary | ICD-10-CM | POA: Diagnosis not present

## 2024-05-21 DIAGNOSIS — M6281 Muscle weakness (generalized): Secondary | ICD-10-CM | POA: Diagnosis not present

## 2024-05-27 DIAGNOSIS — M6281 Muscle weakness (generalized): Secondary | ICD-10-CM | POA: Diagnosis not present

## 2024-05-27 DIAGNOSIS — M25552 Pain in left hip: Secondary | ICD-10-CM | POA: Diagnosis not present

## 2024-05-27 DIAGNOSIS — R262 Difficulty in walking, not elsewhere classified: Secondary | ICD-10-CM | POA: Diagnosis not present

## 2024-05-27 DIAGNOSIS — M7062 Trochanteric bursitis, left hip: Secondary | ICD-10-CM | POA: Diagnosis not present

## 2024-05-29 DIAGNOSIS — M6281 Muscle weakness (generalized): Secondary | ICD-10-CM | POA: Diagnosis not present

## 2024-05-29 DIAGNOSIS — M25552 Pain in left hip: Secondary | ICD-10-CM | POA: Diagnosis not present

## 2024-05-29 DIAGNOSIS — R262 Difficulty in walking, not elsewhere classified: Secondary | ICD-10-CM | POA: Diagnosis not present

## 2024-05-29 DIAGNOSIS — M7062 Trochanteric bursitis, left hip: Secondary | ICD-10-CM | POA: Diagnosis not present

## 2024-06-03 DIAGNOSIS — M7062 Trochanteric bursitis, left hip: Secondary | ICD-10-CM | POA: Diagnosis not present

## 2024-06-03 DIAGNOSIS — M6281 Muscle weakness (generalized): Secondary | ICD-10-CM | POA: Diagnosis not present

## 2024-06-03 DIAGNOSIS — R262 Difficulty in walking, not elsewhere classified: Secondary | ICD-10-CM | POA: Diagnosis not present

## 2024-06-03 DIAGNOSIS — M25552 Pain in left hip: Secondary | ICD-10-CM | POA: Diagnosis not present

## 2024-06-06 DIAGNOSIS — M6281 Muscle weakness (generalized): Secondary | ICD-10-CM | POA: Diagnosis not present

## 2024-06-06 DIAGNOSIS — R262 Difficulty in walking, not elsewhere classified: Secondary | ICD-10-CM | POA: Diagnosis not present

## 2024-06-06 DIAGNOSIS — M7062 Trochanteric bursitis, left hip: Secondary | ICD-10-CM | POA: Diagnosis not present

## 2024-06-06 DIAGNOSIS — M25552 Pain in left hip: Secondary | ICD-10-CM | POA: Diagnosis not present

## 2024-06-10 DIAGNOSIS — M6281 Muscle weakness (generalized): Secondary | ICD-10-CM | POA: Diagnosis not present

## 2024-06-10 DIAGNOSIS — R262 Difficulty in walking, not elsewhere classified: Secondary | ICD-10-CM | POA: Diagnosis not present

## 2024-06-10 DIAGNOSIS — M7062 Trochanteric bursitis, left hip: Secondary | ICD-10-CM | POA: Diagnosis not present

## 2024-06-10 DIAGNOSIS — M25552 Pain in left hip: Secondary | ICD-10-CM | POA: Diagnosis not present

## 2024-06-13 DIAGNOSIS — M25552 Pain in left hip: Secondary | ICD-10-CM | POA: Diagnosis not present

## 2024-06-13 DIAGNOSIS — M6281 Muscle weakness (generalized): Secondary | ICD-10-CM | POA: Diagnosis not present

## 2024-06-13 DIAGNOSIS — M7062 Trochanteric bursitis, left hip: Secondary | ICD-10-CM | POA: Diagnosis not present

## 2024-06-13 DIAGNOSIS — R262 Difficulty in walking, not elsewhere classified: Secondary | ICD-10-CM | POA: Diagnosis not present

## 2024-06-17 DIAGNOSIS — R262 Difficulty in walking, not elsewhere classified: Secondary | ICD-10-CM | POA: Diagnosis not present

## 2024-06-17 DIAGNOSIS — M6281 Muscle weakness (generalized): Secondary | ICD-10-CM | POA: Diagnosis not present

## 2024-06-17 DIAGNOSIS — M25552 Pain in left hip: Secondary | ICD-10-CM | POA: Diagnosis not present

## 2024-06-17 DIAGNOSIS — M7062 Trochanteric bursitis, left hip: Secondary | ICD-10-CM | POA: Diagnosis not present

## 2024-06-19 DIAGNOSIS — M25552 Pain in left hip: Secondary | ICD-10-CM | POA: Diagnosis not present

## 2024-06-19 DIAGNOSIS — R262 Difficulty in walking, not elsewhere classified: Secondary | ICD-10-CM | POA: Diagnosis not present

## 2024-06-19 DIAGNOSIS — M6281 Muscle weakness (generalized): Secondary | ICD-10-CM | POA: Diagnosis not present

## 2024-06-19 DIAGNOSIS — M7062 Trochanteric bursitis, left hip: Secondary | ICD-10-CM | POA: Diagnosis not present

## 2024-06-25 DIAGNOSIS — R262 Difficulty in walking, not elsewhere classified: Secondary | ICD-10-CM | POA: Diagnosis not present

## 2024-06-25 DIAGNOSIS — M25552 Pain in left hip: Secondary | ICD-10-CM | POA: Diagnosis not present

## 2024-06-25 DIAGNOSIS — M7062 Trochanteric bursitis, left hip: Secondary | ICD-10-CM | POA: Diagnosis not present

## 2024-06-25 DIAGNOSIS — M6281 Muscle weakness (generalized): Secondary | ICD-10-CM | POA: Diagnosis not present

## 2024-06-27 DIAGNOSIS — R262 Difficulty in walking, not elsewhere classified: Secondary | ICD-10-CM | POA: Diagnosis not present

## 2024-06-27 DIAGNOSIS — M6281 Muscle weakness (generalized): Secondary | ICD-10-CM | POA: Diagnosis not present

## 2024-06-27 DIAGNOSIS — M25552 Pain in left hip: Secondary | ICD-10-CM | POA: Diagnosis not present

## 2024-06-27 DIAGNOSIS — M7062 Trochanteric bursitis, left hip: Secondary | ICD-10-CM | POA: Diagnosis not present

## 2024-07-01 DIAGNOSIS — M25552 Pain in left hip: Secondary | ICD-10-CM | POA: Diagnosis not present

## 2024-07-01 DIAGNOSIS — M4316 Spondylolisthesis, lumbar region: Secondary | ICD-10-CM | POA: Diagnosis not present

## 2024-07-01 DIAGNOSIS — Z6828 Body mass index (BMI) 28.0-28.9, adult: Secondary | ICD-10-CM | POA: Diagnosis not present

## 2024-07-01 DIAGNOSIS — M6281 Muscle weakness (generalized): Secondary | ICD-10-CM | POA: Diagnosis not present

## 2024-07-01 DIAGNOSIS — M7062 Trochanteric bursitis, left hip: Secondary | ICD-10-CM | POA: Diagnosis not present

## 2024-07-01 DIAGNOSIS — M461 Sacroiliitis, not elsewhere classified: Secondary | ICD-10-CM | POA: Diagnosis not present

## 2024-07-01 DIAGNOSIS — R262 Difficulty in walking, not elsewhere classified: Secondary | ICD-10-CM | POA: Diagnosis not present

## 2024-07-03 DIAGNOSIS — M7062 Trochanteric bursitis, left hip: Secondary | ICD-10-CM | POA: Diagnosis not present

## 2024-07-03 DIAGNOSIS — R262 Difficulty in walking, not elsewhere classified: Secondary | ICD-10-CM | POA: Diagnosis not present

## 2024-07-03 DIAGNOSIS — M25552 Pain in left hip: Secondary | ICD-10-CM | POA: Diagnosis not present

## 2024-07-03 DIAGNOSIS — M6281 Muscle weakness (generalized): Secondary | ICD-10-CM | POA: Diagnosis not present

## 2024-08-05 DIAGNOSIS — M461 Sacroiliitis, not elsewhere classified: Secondary | ICD-10-CM | POA: Diagnosis not present

## 2024-08-19 DIAGNOSIS — E034 Atrophy of thyroid (acquired): Secondary | ICD-10-CM | POA: Diagnosis not present

## 2024-08-22 DIAGNOSIS — E034 Atrophy of thyroid (acquired): Secondary | ICD-10-CM | POA: Diagnosis not present

## 2024-08-22 DIAGNOSIS — M810 Age-related osteoporosis without current pathological fracture: Secondary | ICD-10-CM | POA: Diagnosis not present

## 2024-08-22 DIAGNOSIS — E7800 Pure hypercholesterolemia, unspecified: Secondary | ICD-10-CM | POA: Diagnosis not present

## 2024-08-22 DIAGNOSIS — G4733 Obstructive sleep apnea (adult) (pediatric): Secondary | ICD-10-CM | POA: Diagnosis not present

## 2024-09-22 ENCOUNTER — Other Ambulatory Visit: Payer: Self-pay | Admitting: Internal Medicine

## 2024-09-22 DIAGNOSIS — Z1231 Encounter for screening mammogram for malignant neoplasm of breast: Secondary | ICD-10-CM

## 2024-10-10 ENCOUNTER — Ambulatory Visit
Admission: RE | Admit: 2024-10-10 | Discharge: 2024-10-10 | Disposition: A | Source: Ambulatory Visit | Attending: Internal Medicine

## 2024-10-10 DIAGNOSIS — Z1231 Encounter for screening mammogram for malignant neoplasm of breast: Secondary | ICD-10-CM | POA: Diagnosis present
